# Patient Record
Sex: Male | Born: 1955 | Race: White | Hispanic: No | Marital: Single | Smoking: Former smoker
Health system: Southern US, Community
[De-identification: ages and names within clinical notes are randomized; demographics above are authoritative.]

## PROBLEM LIST (undated history)

## (undated) DIAGNOSIS — Z95828 Presence of other vascular implants and grafts: Secondary | ICD-10-CM

## (undated) DIAGNOSIS — I2510 Atherosclerotic heart disease of native coronary artery without angina pectoris: Secondary | ICD-10-CM

## (undated) DIAGNOSIS — C801 Malignant (primary) neoplasm, unspecified: Secondary | ICD-10-CM

## (undated) DIAGNOSIS — D649 Anemia, unspecified: Secondary | ICD-10-CM

## (undated) DIAGNOSIS — K219 Gastro-esophageal reflux disease without esophagitis: Secondary | ICD-10-CM

## (undated) DIAGNOSIS — N3281 Overactive bladder: Secondary | ICD-10-CM

## (undated) DIAGNOSIS — Z87442 Personal history of urinary calculi: Secondary | ICD-10-CM

## (undated) HISTORY — PX: COLON SURGERY: SHX602

---

## 2020-02-23 ENCOUNTER — Telehealth: Payer: Self-pay | Admitting: Oncology

## 2020-02-23 NOTE — Telephone Encounter (Signed)
A new pt appt has been schedule for Clinton Cordova to see Dr. Benay Spice on 4/5 at 2pm for metastatic rectal cancer. Appt date and time has been given to the pt's sgo. Aware to arrive 15 minutes early.

## 2020-02-26 NOTE — Progress Notes (Signed)
Spoke to patient regarding upcoming appointment he preferred I call and speak to his girlfriend [place].  I called Debbie introduced myself as GI nurse navigator and explained my role.  Informed her his appointment is 4/8 at 2 pm with Dr. Benay Spice to arrive at least 15 minutes early for registration; [place] parking is available.  She is knowledgeable about where we are located and she will be coming with him for his appointment.  She was given my direct phone number for any questions or concerns and verbalized an understanding of all the information given.

## 2020-03-04 ENCOUNTER — Ambulatory Visit: Payer: Self-pay | Admitting: Oncology

## 2020-03-07 ENCOUNTER — Other Ambulatory Visit: Payer: Self-pay

## 2020-03-07 ENCOUNTER — Inpatient Hospital Stay: Payer: Medicare Other | Attending: Oncology | Admitting: Oncology

## 2020-03-07 ENCOUNTER — Encounter (INDEPENDENT_AMBULATORY_CARE_PROVIDER_SITE_OTHER): Payer: Self-pay

## 2020-03-07 VITALS — BP 141/75 | HR 95 | Temp 98.9°F | Resp 20 | Ht 72.0 in | Wt 164.1 lb

## 2020-03-07 DIAGNOSIS — C661 Malignant neoplasm of right ureter: Secondary | ICD-10-CM | POA: Insufficient documentation

## 2020-03-07 DIAGNOSIS — Z7189 Other specified counseling: Secondary | ICD-10-CM | POA: Diagnosis not present

## 2020-03-07 DIAGNOSIS — C787 Secondary malignant neoplasm of liver and intrahepatic bile duct: Secondary | ICD-10-CM | POA: Insufficient documentation

## 2020-03-07 DIAGNOSIS — C786 Secondary malignant neoplasm of retroperitoneum and peritoneum: Secondary | ICD-10-CM | POA: Insufficient documentation

## 2020-03-07 DIAGNOSIS — I110 Hypertensive heart disease with heart failure: Secondary | ICD-10-CM | POA: Insufficient documentation

## 2020-03-07 DIAGNOSIS — C20 Malignant neoplasm of rectum: Secondary | ICD-10-CM | POA: Diagnosis not present

## 2020-03-07 DIAGNOSIS — Z79899 Other long term (current) drug therapy: Secondary | ICD-10-CM | POA: Insufficient documentation

## 2020-03-07 DIAGNOSIS — Z933 Colostomy status: Secondary | ICD-10-CM | POA: Insufficient documentation

## 2020-03-07 DIAGNOSIS — Z5111 Encounter for antineoplastic chemotherapy: Secondary | ICD-10-CM | POA: Insufficient documentation

## 2020-03-07 DIAGNOSIS — I2510 Atherosclerotic heart disease of native coronary artery without angina pectoris: Secondary | ICD-10-CM | POA: Insufficient documentation

## 2020-03-07 DIAGNOSIS — I255 Ischemic cardiomyopathy: Secondary | ICD-10-CM | POA: Insufficient documentation

## 2020-03-07 DIAGNOSIS — N1330 Unspecified hydronephrosis: Secondary | ICD-10-CM | POA: Insufficient documentation

## 2020-03-07 NOTE — Progress Notes (Signed)
[facility] New Patient Consult   Requesting ZO:XWRUEA Liliana Brentlinger 64 y.o.  06-28-1956    Reason for Consult: Rectal cancer   HPI: Mr. Danford presented in March 2020 with abdominal pain.  He was diagnosed with diverticulitis with a rectosigmoid fistula.  He had a prolonged treatment course requiring placement of a drain and underwent a diverting ileostomy in June 2020.  He underwent placement of a drain for a presacral abscess in October 2020.  A colonoscopy on 10/17/2019 revealed an obstructing mass at 10 cm from the anal verge.  A biopsy confirmed adenocarcinoma.  A pelvic MRI was consistent with a T3/4 N1 lesion.  There was no evidence of metastatic disease. He was treated with neoadjuvant radiation He was taken to the operating room by Dr. Cecil Cobbs on 01/17/2020 for a low anterior resection with end colostomy, drainage of pelvic abscess, omental flap, liver biopsy, peritoneal biopsy, and ileostomy reversal.  Operative findings included 2 white deposits at the sacral promontory peritoneum.  A 1 cm liver mass in segment 4 was biopsied.  No other evidence of carcinomatosis or liver metastases.  Chronic posterior perforation of the rectum with a fibrotic mass in the pelvis involving the sidewalls and bladder.  There was right hydroureter with near complete obstruction at the bladder.  Impending obstruction of the left ureter near the bladder. A palliative resection was performed.  A left ureter stent was placed in the right ureter was reimplanted into the dome of the bladder.  The pathology revealed adenocarcinoma involving peritoneal deposits.  The rectal tumor was moderately differentiated and invaded through the peritoneal surface with perforation.  Lymphovascular and perineural invasion were identified.  The distal margin was involved by tumor.  A tumor deposit was present at the vascular pedicle margin.  15 lymph nodes were negative.  The liver biopsy returned  positive for metastatic adenocarcinoma, the distal right ureter biopsy was positive for adenocarcinoma.  Treatment response score 2.  The tumor was staged as a ypT4b,p n1c,p m1c.  Intact mismatch repair protein staining and MSS.  He has recovered from surgery.  Pelvic drains remain in place.  A left ureter stent remains in place.  He saw Dr. Aleatha Borer on 02/23/2020.  Restaging CT reveal a liver metastasis at the hepatic dome and a subcapsular implant at the right medial liver.  Nodularity at the left pelvic sidewall.  Dr. Sigurd Sos recommends treatment with systemic therapy.  He is referred for care closer to home.   Past medical history: 1.  Ischemic cardiomyopathy 2.  Rectal cancer, presenting with a perforated rectal tumor,ypT4b,ypN1c, M1c 3.  Pulmonary hypertension 4.  Hypertension   Past surgical history: 1.  Pilonidal cyst surgery at age 67   Medications: Reviewed  Allergies: Not on File  Family history: No family history of cancer  Social History:   He lives in [place].  He previously worked as a Administrator and on a farm.  He does not use cigarettes.  He has a history of heavy alcohol use, none at present.  He has been transfused with packed red blood cells during the recent rectal cancer illness.  No risk factor for HIV or hepatitis.  ROS:   Positives include: Night sweats on 2 occasions over the past 3 months, nausea following surgery-resolved, rectal bleeding for 6 months prior to diagnosis  A complete ROS was otherwise negative.  Physical Exam:  Blood pressure (!) 141/75, pulse 95, temperature 98.9 F (37.2 C), temperature source  Temporal, resp. rate 20, height 6' (1.829 m), weight 164 lb 1.6 oz (74.4 kg), SpO2 100 %.  HEENT: Neck without mass Lungs: Clear bilaterally Cardiac: Regular rate and rhythm Abdomen: No hepatosplenomegaly, lower abdomen drains, left abdomen colostomy, healed surgical incisions Vascular: No leg edema Lymph nodes: No cervical,  supraclavicular, axillary, or inguinal nodes Neurologic: Alert and oriented, the motor exam appears intact in the upper and lower extremities bilaterally Skin: No rash Musculoskeletal: No spine tenderness    Imaging:  No results found.    Assessment/Plan:   1. Metastatic rectal cancer, perforated rectal tumor with abscess, stage IV  Presented with "diverticulitis "March 2020, status post drainage catheter  Colonoscopy 10/17/2019-obstructing mass at 10 cm, biopsy confirmed adenocarcinoma  MRI pelvis 10/22/2019-T3 versus T4, into, M0 lesion-evaluation limited due to abscess  Neoadjuvant radiation-short course  01/17/2020-palliative low anterior resection, end colostomy, biopsy of peritoneal nodules, liver biopsy, left ureter stent, right ureter reimplantation, placement of pelvic drains  Operative findings included chronic perforation of the rectum with involvement of the pelvic sidewalls and bladder, 2 peritoneal nodules at the sacral promontory, 1 cm mass at segment 4 of the liver  Pathology confirmed a ypT4n, pN1c,M1c, moderately differentiated adenocarcinoma, lymphovascular and perineural invasion present, treatment response score 2, positive distal and radial margins, 0/15 lymph nodes, 1 tumor deposit, no loss of mismatch repair protein expression, MSS, positive for adenocarcinoma involving the right ureter, segment 4 liver, and peritoneal deposits  CTs chest, abdomen, and pelvis 02/23/2020-2 pelvic surgical drains, 2.1 cm hepatic dome hypodensity, 1.8 cm peripheral segment 6/7 lesion, bilateral ureter stents, mild left hydroureteronephrosis, 2 new right lower lobe 0.3 cm nodules-indeterminate  2.   Diverticulitis/perforated rectal tumor-pelvic drains remain in place  3.   Coronary artery disease  4.   Chronic systolic heart failure  5.   History of an LV thrombus, status post course of Eliquis, no thrombus visualized on echocardiogram 09/15/2019, LVEF 45-50  Disposition:    Mr. Gosselin has been diagnosed with metastatic rectal cancer.  He was found to have peritoneal and liver metastases at the time of a palliative low anterior resection procedure.  CTs on 02/23/2020 revealed evidence of liver metastases and new indeterminate lung nodules.  I discussed the prognosis and treatment options with Mr. Alms and his girlfriend.  He understands no therapy will be curative.  The plan is to begin palliative chemotherapy with FOLFOX.  We reviewed potential toxicities of the FOLFOX regimen including the chance for nausea/vomiting, mucositis, diarrhea, alopecia, and hematologic toxicity.  We discussed the sun sensitivity, rash, hyperpigmentation, and hand/foot syndrome seen with 5-fluorouracil.  We discussed the allergic reaction and various types of neuropathy associated with oxaliplatin.  He agrees to proceed.  Mr. Sahni will return for a chemotherapy teaching class and baseline laboratory studies on 03/11/2020.  He will be referred for Port-A-Cath placement.  The plan is to begin FOLFOX on 03/20/2020.  Mr. Opiela will be scheduled for an office visit on 03/20/2020.  Dr. Aleatha Borer has requested Foundation 1 testing and will forward these results to [place].  He will continue follow-up with Dr. Hyman Bower for management of the pelvic drains.  Betsy Coder, MD  03/07/2020, 2:27 PM

## 2020-03-07 NOTE — Progress Notes (Signed)
START ON PATHWAY REGIMEN - Colorectal     A cycle is every 14 days:     Oxaliplatin      Leucovorin      Fluorouracil      Fluorouracil   **Always confirm dose/schedule in your pharmacy ordering system**  Patient Characteristics: Distant Metastases, Nonsurgical Candidate, KRAS/NRAS Mutation Positive/Unknown (BRAF V600 Wild-Type/Unknown), Standard Cytotoxic Therapy, First Line Standard Cytotoxic Therapy, Bevacizumab Ineligible, PS = 0,1 Tumor Location: Colon Therapeutic Status: Distant Metastases Microsatellite/Mismatch Repair Status: MSS/pMMR BRAF Mutation Status: Awaiting Test Results KRAS/NRAS Mutation Status: Awaiting Test Results Standard Cytotoxic Line of Therapy: First Line Standard Cytotoxic Therapy ECOG Performance Status: 1 Bevacizumab Eligibility: Ineligible Intent of Therapy: Non-Curative / Palliative Intent, Discussed with Patient 

## 2020-03-07 NOTE — Progress Notes (Signed)
Met with patient and his girlfriend Debbie at initial medical oncology appointment with Dr. Benay Spice.  I explained my role as GI nurse navigator and they were given my business card with my directly phone number and encouraged to call with any questions or concerns.  I scheduled them for chemo class on 4/15 at 4:00 and lab work at 3:45 same day.  I will call to schedule placing a port a cath and f/u to make sure his first treatment of Folfox is scheduled for Wednesday 4/21 with pump d/c on 4/23.  I answered all their questions and escorted them out.

## 2020-03-08 ENCOUNTER — Telehealth: Payer: Self-pay | Admitting: Oncology

## 2020-03-08 NOTE — Progress Notes (Signed)
Spoke with patient's girlfriend Debbie regarding appointment to have his port placed.  I have it scheduled for Thursday 4/15 to arrive at [facility] at 10:00 am, NPO past Midnight and he must have a driver.  She verbalized an understanding and agrees with the appointment date/time.

## 2020-03-08 NOTE — Telephone Encounter (Signed)
Scheduled per los. Called and left msg. Mailed printout  °

## 2020-03-12 ENCOUNTER — Inpatient Hospital Stay: Payer: Medicare Other

## 2020-03-12 ENCOUNTER — Other Ambulatory Visit: Payer: Self-pay | Admitting: Radiology

## 2020-03-13 ENCOUNTER — Other Ambulatory Visit: Payer: Self-pay | Admitting: Radiology

## 2020-03-13 NOTE — Progress Notes (Signed)
Pharmacist Chemotherapy Monitoring - Initial Assessment    Anticipated start date: 03/19/20  Regimen:  . Are orders appropriate based on the patient's diagnosis, regimen, and cycle? Yes . Does the plan date match the patient's scheduled date? Yes . Is the sequencing of drugs appropriate? Yes . Are the premedications appropriate for the patient's regimen? Yes . Prior Authorization for treatment is: Not Started o If applicable, is the correct biosimilar selected based on the patient's insurance? not applicable  Organ Function and Labs: Marland Kitchen Are dose adjustments needed based on the patient's renal function, hepatic function, or hematologic function? Will need to f/u labs done on 03/19/20 . Are appropriate labs ordered prior to the start of patient's treatment? Yes . Other organ system assessment, if indicated: N/A . The following baseline labs, if indicated, have been ordered: CBC, CMET and CEA ordered for 03/19/20  Dose Assessment: . Are the drug doses appropriate? Yes . Are the following correct: o Drug concentrations Yes o IV fluid compatible with drug Yes o Administration routes Yes o Timing of therapy Yes . If applicable, does the patient have documented access for treatment and/or plans for port-a-cath placement? Yes - 03/14/20 . If applicable, have lifetime cumulative doses been properly documented and assessed? not applicable Lifetime Dose Tracking  No doses have been documented on this patient for the following tracked chemicals: Doxorubicin, Epirubicin, Idarubicin, Daunorubicin, Mitoxantrone, Bleomycin, Oxaliplatin, Carboplatin, Liposomal Doxorubicin  o   Toxicity Monitoring/Prevention: . The patient has the following take home antiemetics prescribed: Ondansetron, put will need Rx for compazine (Aloxi received as premedication) . The patient has the following take home medications prescribed: N/A . Medication allergies and previous infusion related reactions, if applicable, have  been reviewed and addressed. No - allergy status needs to entered/updated . The patient's current medication list has been assessed for drug-drug interactions with their chemotherapy regimen. no significant drug-drug interactions were identified on review.  Order Review: . Are the treatment plan orders signed? No . Is the patient scheduled to see a provider prior to their treatment? Yes  I verify that I have reviewed each item in the above checklist and answered each question accordingly.  Acquanetta Belling 03/13/2020 1:00 PM

## 2020-03-14 ENCOUNTER — Inpatient Hospital Stay (HOSPITAL_BASED_OUTPATIENT_CLINIC_OR_DEPARTMENT_OTHER): Payer: Medicare Other | Admitting: Medical

## 2020-03-14 ENCOUNTER — Ambulatory Visit (HOSPITAL_COMMUNITY)
Admission: RE | Admit: 2020-03-14 | Discharge: 2020-03-14 | Disposition: A | Discharge status: Home / Self Care | Payer: Medicare Other | Source: Ambulatory Visit | Attending: Oncology | Admitting: Oncology

## 2020-03-14 ENCOUNTER — Encounter (HOSPITAL_COMMUNITY): Payer: Self-pay

## 2020-03-14 ENCOUNTER — Inpatient Hospital Stay: Payer: Medicare Other

## 2020-03-14 ENCOUNTER — Other Ambulatory Visit: Payer: Self-pay

## 2020-03-14 ENCOUNTER — Encounter: Payer: Self-pay | Admitting: Oncology

## 2020-03-14 ENCOUNTER — Other Ambulatory Visit: Payer: Self-pay | Admitting: Oncology

## 2020-03-14 ENCOUNTER — Other Ambulatory Visit: Payer: Self-pay | Admitting: Emergency Medicine

## 2020-03-14 VITALS — BP 153/84 | HR 88 | Temp 98.5°F | Resp 20 | Ht 72.0 in | Wt 171.4 lb

## 2020-03-14 DIAGNOSIS — N179 Acute kidney failure, unspecified: Secondary | ICD-10-CM | POA: Diagnosis not present

## 2020-03-14 DIAGNOSIS — Z923 Personal history of irradiation: Secondary | ICD-10-CM | POA: Insufficient documentation

## 2020-03-14 DIAGNOSIS — Z933 Colostomy status: Secondary | ICD-10-CM | POA: Insufficient documentation

## 2020-03-14 DIAGNOSIS — Z87891 Personal history of nicotine dependence: Secondary | ICD-10-CM | POA: Insufficient documentation

## 2020-03-14 DIAGNOSIS — C20 Malignant neoplasm of rectum: Secondary | ICD-10-CM

## 2020-03-14 DIAGNOSIS — D649 Anemia, unspecified: Secondary | ICD-10-CM

## 2020-03-14 DIAGNOSIS — I255 Ischemic cardiomyopathy: Secondary | ICD-10-CM | POA: Diagnosis not present

## 2020-03-14 DIAGNOSIS — Z5111 Encounter for antineoplastic chemotherapy: Secondary | ICD-10-CM | POA: Diagnosis present

## 2020-03-14 DIAGNOSIS — C786 Secondary malignant neoplasm of retroperitoneum and peritoneum: Secondary | ICD-10-CM | POA: Diagnosis not present

## 2020-03-14 DIAGNOSIS — N1330 Unspecified hydronephrosis: Secondary | ICD-10-CM | POA: Diagnosis not present

## 2020-03-14 DIAGNOSIS — C661 Malignant neoplasm of right ureter: Secondary | ICD-10-CM | POA: Diagnosis not present

## 2020-03-14 DIAGNOSIS — I2510 Atherosclerotic heart disease of native coronary artery without angina pectoris: Secondary | ICD-10-CM | POA: Diagnosis not present

## 2020-03-14 DIAGNOSIS — I110 Hypertensive heart disease with heart failure: Secondary | ICD-10-CM | POA: Diagnosis not present

## 2020-03-14 DIAGNOSIS — Z79899 Other long term (current) drug therapy: Secondary | ICD-10-CM | POA: Insufficient documentation

## 2020-03-14 DIAGNOSIS — C787 Secondary malignant neoplasm of liver and intrahepatic bile duct: Secondary | ICD-10-CM | POA: Diagnosis not present

## 2020-03-14 HISTORY — PX: IR IMAGING GUIDED PORT INSERTION: IMG5740

## 2020-03-14 LAB — BASIC METABOLIC PANEL
Anion gap: 10 (ref 5–15)
BUN: 31 mg/dL — ABNORMAL HIGH (ref 8–23)
CO2: 18 mmol/L — ABNORMAL LOW (ref 22–32)
Calcium: 8.9 mg/dL (ref 8.9–10.3)
Chloride: 112 mmol/L — ABNORMAL HIGH (ref 98–111)
Creatinine, Ser: 2.87 mg/dL — ABNORMAL HIGH (ref 0.61–1.24)
GFR calc Af Amer: 25 mL/min — ABNORMAL LOW (ref >60)
GFR calc non Af Amer: 22 mL/min — ABNORMAL LOW (ref >60)
Glucose, Bld: 90 mg/dL (ref 70–99)
Potassium: 4.4 mmol/L (ref 3.5–5.1)
Sodium: 140 mmol/L (ref 135–145)

## 2020-03-14 LAB — CBC WITH DIFFERENTIAL (CANCER CENTER ONLY)
Abs Immature Granulocytes: 0.03 10*3/uL (ref 0.00–0.07)
Basophils Absolute: 0 10*3/uL (ref 0.0–0.1)
Basophils Relative: 0 %
Eosinophils Absolute: 0.4 10*3/uL (ref 0.0–0.5)
Eosinophils Relative: 5 %
HCT: 26 % — ABNORMAL LOW (ref 39.0–52.0)
Hemoglobin: 7.5 g/dL — ABNORMAL LOW (ref 13.0–17.0)
Immature Granulocytes: 0 %
Lymphocytes Relative: 7 %
Lymphs Abs: 0.5 10*3/uL — ABNORMAL LOW (ref 0.7–4.0)
MCH: 24.5 pg — ABNORMAL LOW (ref 26.0–34.0)
MCHC: 28.8 g/dL — ABNORMAL LOW (ref 30.0–36.0)
MCV: 85 fL (ref 80.0–100.0)
Monocytes Absolute: 0.4 10*3/uL (ref 0.1–1.0)
Monocytes Relative: 5 %
Neutro Abs: 6.4 10*3/uL (ref 1.7–7.7)
Neutrophils Relative %: 83 %
Platelet Count: 405 10*3/uL — ABNORMAL HIGH (ref 150–400)
RBC: 3.06 MIL/uL — ABNORMAL LOW (ref 4.22–5.81)
RDW: 16.4 % — ABNORMAL HIGH (ref 11.5–15.5)
WBC Count: 7.7 10*3/uL (ref 4.0–10.5)
nRBC: 0 % (ref 0.0–0.2)

## 2020-03-14 LAB — CBC WITH DIFFERENTIAL/PLATELET
Abs Immature Granulocytes: 0.03 10*3/uL (ref 0.00–0.07)
Basophils Absolute: 0 10*3/uL (ref 0.0–0.1)
Basophils Relative: 0 %
Eosinophils Absolute: 0.5 10*3/uL (ref 0.0–0.5)
Eosinophils Relative: 7 %
HCT: 24.3 % — ABNORMAL LOW (ref 39.0–52.0)
Hemoglobin: 7.1 g/dL — ABNORMAL LOW (ref 13.0–17.0)
Immature Granulocytes: 0 %
Lymphocytes Relative: 14 %
Lymphs Abs: 1 10*3/uL (ref 0.7–4.0)
MCH: 25.4 pg — ABNORMAL LOW (ref 26.0–34.0)
MCHC: 29.2 g/dL — ABNORMAL LOW (ref 30.0–36.0)
MCV: 86.8 fL (ref 80.0–100.0)
Monocytes Absolute: 0.5 10*3/uL (ref 0.1–1.0)
Monocytes Relative: 8 %
Neutro Abs: 4.8 10*3/uL (ref 1.7–7.7)
Neutrophils Relative %: 71 %
Platelets: 364 10*3/uL (ref 150–400)
RBC: 2.8 MIL/uL — ABNORMAL LOW (ref 4.22–5.81)
RDW: 16.3 % — ABNORMAL HIGH (ref 11.5–15.5)
WBC: 6.8 10*3/uL (ref 4.0–10.5)
nRBC: 0 % (ref 0.0–0.2)

## 2020-03-14 LAB — CMP (CANCER CENTER ONLY)
ALT: 8 U/L (ref 0–44)
AST: 7 U/L — ABNORMAL LOW (ref 15–41)
Albumin: 2.7 g/dL — ABNORMAL LOW (ref 3.5–5.0)
Alkaline Phosphatase: 79 U/L (ref 38–126)
Anion gap: 10 (ref 5–15)
BUN: 28 mg/dL — ABNORMAL HIGH (ref 8–23)
CO2: 19 mmol/L — ABNORMAL LOW (ref 22–32)
Calcium: 9.2 mg/dL (ref 8.9–10.3)
Chloride: 112 mmol/L — ABNORMAL HIGH (ref 98–111)
Creatinine: 3.03 mg/dL (ref 0.61–1.24)
GFR, Est AFR Am: 24 mL/min — ABNORMAL LOW (ref >60)
GFR, Estimated: 21 mL/min — ABNORMAL LOW (ref >60)
Glucose, Bld: 171 mg/dL — ABNORMAL HIGH (ref 70–99)
Potassium: 4.4 mmol/L (ref 3.5–5.1)
Sodium: 141 mmol/L (ref 135–145)
Total Bilirubin: <0.2 mg/dL — ABNORMAL LOW (ref 0.3–1.2)
Total Protein: 7.9 g/dL (ref 6.5–8.1)

## 2020-03-14 LAB — PREPARE RBC (CROSSMATCH)

## 2020-03-14 LAB — PROTIME-INR
INR: 1.2 (ref 0.8–1.2)
Prothrombin Time: 14.9 seconds (ref 11.4–15.2)

## 2020-03-14 MED ORDER — LIDOCAINE-EPINEPHRINE 1 %-1:100000 IJ SOLN
INTRAMUSCULAR | Status: AC | PRN
  Administered 2020-03-14 (×2): 10 mL via INTRADERMAL

## 2020-03-14 MED ORDER — MIDAZOLAM HCL 2 MG/2ML IJ SOLN
INTRAMUSCULAR | Status: AC | PRN
  Administered 2020-03-14 (×2): 1 mg via INTRAVENOUS

## 2020-03-14 MED ORDER — SODIUM CHLORIDE 0.9 % IV SOLN: INTRAVENOUS | Status: DC

## 2020-03-14 MED ORDER — CEFAZOLIN SODIUM-DEXTROSE 2-4 GM/100ML-% IV SOLN
INTRAVENOUS | Status: AC
  Administered 2020-03-14: 2 g via INTRAVENOUS
  Filled 2020-03-14: qty 100

## 2020-03-14 MED ORDER — HEPARIN SOD (PORK) LOCK FLUSH 100 UNIT/ML IV SOLN
INTRAVENOUS | Status: AC
  Filled 2020-03-14: qty 5

## 2020-03-14 MED ORDER — LIDOCAINE-EPINEPHRINE 1 %-1:100000 IJ SOLN
INTRAMUSCULAR | Status: AC
  Filled 2020-03-14: qty 1

## 2020-03-14 MED ORDER — MIDAZOLAM HCL 2 MG/2ML IJ SOLN
INTRAMUSCULAR | Status: AC
  Filled 2020-03-14: qty 2

## 2020-03-14 MED ORDER — FENTANYL CITRATE (PF) 100 MCG/2ML IJ SOLN
INTRAMUSCULAR | Status: AC
  Filled 2020-03-14: qty 2

## 2020-03-14 MED ORDER — CEFAZOLIN SODIUM-DEXTROSE 2-4 GM/100ML-% IV SOLN: 2.0000 g | INTRAVENOUS | Status: AC

## 2020-03-14 MED ORDER — HEPARIN SOD (PORK) LOCK FLUSH 100 UNIT/ML IV SOLN
INTRAVENOUS | Status: AC | PRN
  Administered 2020-03-14: 500 [IU] via INTRAVENOUS

## 2020-03-14 MED ORDER — FENTANYL CITRATE (PF) 100 MCG/2ML IJ SOLN
INTRAMUSCULAR | Status: AC | PRN
  Administered 2020-03-14 (×2): 50 ug via INTRAVENOUS

## 2020-03-14 NOTE — Patient Instructions (Signed)
Anemia  Anemia is a condition in which you do not have enough red blood cells or hemoglobin. Hemoglobin is a substance in red blood cells that carries oxygen. When you do not have enough red blood cells or hemoglobin (are anemic), your body cannot get enough oxygen and your organs may not work properly. As a result, you may feel very tired or have other problems. What are the causes? Common causes of anemia include:  Excessive bleeding. Anemia can be caused by excessive bleeding inside or outside the body, including bleeding from the intestine or from periods in women.  Poor nutrition.  Long-lasting (chronic) kidney, thyroid, and liver disease.  Bone marrow disorders.  Cancer and treatments for cancer.  HIV (human immunodeficiency virus) and AIDS (acquired immunodeficiency syndrome).  Treatments for HIV and AIDS.  Spleen problems.  Blood disorders.  Infections, medicines, and autoimmune disorders that destroy red blood cells. What are the signs or symptoms? Symptoms of this condition include:  Minor weakness.  Dizziness.  Headache.  Feeling heartbeats that are irregular or faster than normal (palpitations).  Shortness of breath, especially with exercise.  Paleness.  Cold sensitivity.  Indigestion.  Nausea.  Difficulty sleeping.  Difficulty concentrating. Symptoms may occur suddenly or develop slowly. If your anemia is mild, you may not have symptoms. How is this diagnosed? This condition is diagnosed based on:  Blood tests.  Your medical history.  A physical exam.  Bone marrow biopsy. Your health care provider may also check your stool (feces) for blood and may do additional testing to look for the cause of your bleeding. You may also have other tests, including:  Imaging tests, such as a CT scan or MRI.  Endoscopy.  Colonoscopy. How is this treated? Treatment for this condition depends on the cause. If you continue to lose a lot of blood, you may  need to be treated at a hospital. Treatment may include:  Taking supplements of iron, vitamin S31, or folic acid.  Taking a hormone medicine (erythropoietin) that can help to stimulate red blood cell growth.  Having a blood transfusion. This may be needed if you lose a lot of blood.  Making changes to your diet.  Having surgery to remove your spleen. Follow these instructions at home:  Take over-the-counter and prescription medicines only as told by your health care provider.  Take supplements only as told by your health care provider.  Follow any diet instructions that you were given.  Keep all follow-up visits as told by your health care provider. This is important. Contact a health care provider if:  You develop new bleeding anywhere in the body. Get help right away if:  You are very weak.  You are short of breath.  You have pain in your abdomen or chest.  You are dizzy or feel faint.  You have trouble concentrating.  You have bloody or black, tarry stools.  You vomit repeatedly or you vomit up blood. Summary  Anemia is a condition in which you do not have enough red blood cells or enough of a substance in your red blood cells that carries oxygen (hemoglobin).  Symptoms may occur suddenly or develop slowly.  If your anemia is mild, you may not have symptoms.  This condition is diagnosed with blood tests as well as a medical history and physical exam. Other tests may be needed.  Treatment for this condition depends on the cause of the anemia. This information is not intended to replace advice given to you by  your health care provider. Make sure you discuss any questions you have with your health care provider. Document Revised: 10/29/2017 Document Reviewed: 12/18/2016 Elsevier Patient Education  [employer].

## 2020-03-14 NOTE — Progress Notes (Signed)
Received critical lab value: Creatinine 3.03.  PA Lucianne Lei made aware.  Will recheck creatinine tomorrow when accessing port before pt's blood transfusion.  Pt aware to push oral fluids at home, and to bring blue blood bracelet to appt tomorrow at 12. Blood transfusion orders placed, VO to cancel benadryl pre-med as pt is driving himself tomorrow.

## 2020-03-14 NOTE — Procedures (Signed)
Interventional Radiology Procedure Note  Procedure: RT IJ POWER PORT  Complications: None  Estimated Blood Loss: MIN  Findings: TIP SVCRA       

## 2020-03-14 NOTE — Discharge Instructions (Addendum)
Please call Interventional Radiology clinic [phone] with any questions or concerns about your port.  You may remove your dressing and shower tomorrow.  DO NOT use EMLA cream for 2 weeks after port placement as this cream will remove surgical glue on your incision.  Implanted Port Insertion, Care After This sheet gives you information about how to care for yourself after your procedure. Your health care provider may also give you more specific instructions. If you have problems or questions, contact your health care provider. What can I expect after the procedure? After the procedure, it is common to have:  Discomfort at the port insertion site.  Bruising on the skin over the port. This should improve over 3-4 days. Follow these instructions at home: Port care  After your port is placed, you will get a manufacturer's information card. The card has information about your port. Keep this card with you at all times.  Take care of the port as told by your health care provider. Ask your health care provider if you or a family member can get training for taking care of the port at home. A home health care nurse may also take care of the port.  Make sure to remember what type of port you have. Incision care      Follow instructions from your health care provider about how to take care of your port insertion site. Make sure you: ? Wash your hands with soap and water before and after you change your bandage (dressing). If soap and water are not available, use hand sanitizer. ? Change your dressing as told by your health care provider. ? Leave stitches (sutures), skin glue, or adhesive strips in place. These skin closures may need to stay in place for 2 weeks or longer. If adhesive strip edges start to loosen and curl up, you may trim the loose edges. Do not remove adhesive strips completely unless your health care provider tells you to do that.  Check your port insertion site every day for  signs of infection. Check for: ? Redness, swelling, or pain. ? Fluid or blood. ? Warmth. ? Pus or a bad smell. Activity  Return to your normal activities as told by your health care provider. Ask your health care provider what activities are safe for you.  Do not lift anything that is heavier than 10 lb (4.5 kg), or the limit that you are told, until your health care provider says that it is safe. General instructions  Take over-the-counter and prescription medicines only as told by your health care provider.  Do not take baths, swim, or use a hot tub until your health care provider approves. Ask your health care provider if you may take showers. You may only be allowed to take sponge baths.  Do not drive for 24 hours if you were given a sedative during your procedure.  Wear a medical alert bracelet in case of an emergency. This will tell any health care providers that you have a port.  Keep all follow-up visits as told by your health care provider. This is important. Contact a health care provider if:  You cannot flush your port with saline as directed, or you cannot draw blood from the port.  You have a fever or chills.  You have redness, swelling, or pain around your port insertion site.  You have fluid or blood coming from your port insertion site.  Your port insertion site feels warm to the touch.  You have pus or   a bad smell coming from the port insertion site. Get help right away if:  You have chest pain or shortness of breath.  You have bleeding from your port that you cannot control. Summary  Take care of the port as told by your health care provider. Keep the manufacturer's information card with you at all times.  Change your dressing as told by your health care provider.  Contact a health care provider if you have a fever or chills or if you have redness, swelling, or pain around your port insertion site.  Keep all follow-up visits as told by your health care  provider. This information is not intended to replace advice given to you by your health care provider. Make sure you discuss any questions you have with your health care provider. Document Revised: 06/14/2018 Document Reviewed: 06/14/2018 Elsevier Patient Education  2020 Elsevier Inc.    Moderate Conscious Sedation, Adult, Care After These instructions provide you with information about caring for yourself after your procedure. Your health care provider may also give you more specific instructions. Your treatment has been planned according to current medical practices, but problems sometimes occur. Call your health care provider if you have any problems or questions after your procedure. What can I expect after the procedure? After your procedure, it is common:  To feel sleepy for several hours.  To feel clumsy and have poor balance for several hours.  To have poor judgment for several hours.  To vomit if you eat too soon. Follow these instructions at home: For at least 24 hours after the procedure:   Do not: ? Participate in activities where you could fall or become injured. ? Drive. ? Use heavy machinery. ? Drink alcohol. ? Take sleeping pills or medicines that cause drowsiness. ? Make important decisions or sign legal documents. ? Take care of children on your own.  Rest. Eating and drinking  Follow the diet recommended by your health care provider.  If you vomit: ? Drink water, juice, or soup when you can drink without vomiting. ? Make sure you have little or no nausea before eating solid foods. General instructions  Have a responsible adult stay with you until you are awake and alert.  Take over-the-counter and prescription medicines only as told by your health care provider.  If you smoke, do not smoke without supervision.  Keep all follow-up visits as told by your health care provider. This is important. Contact a health care provider if:  You keep feeling  nauseous or you keep vomiting.  You feel light-headed.  You develop a rash.  You have a fever. Get help right away if:  You have trouble breathing. This information is not intended to replace advice given to you by your health care provider. Make sure you discuss any questions you have with your health care provider. Document Revised: 10/29/2017 Document Reviewed: 03/07/2016 Elsevier Patient Education  2020 Elsevier Inc.  

## 2020-03-14 NOTE — Progress Notes (Signed)
cbc

## 2020-03-14 NOTE — Progress Notes (Signed)
Met with patient at registration to introduce myself as Financial Resource Specialist and to offer available resources.  Discussed one-time $1000 [employer] grant and qualifications to assist with personal expenses while going through treatment.  Gave him my card if interested in applying and for any additional financial questions or concerns.  

## 2020-03-14 NOTE — H&P (Signed)
Chief Complaint: Patient was seen in consultation today for rectal cancer/Port-a-cath placement.  Referring Physician(s): Ladell Pier  Supervising Physician: Daryll Brod  Patient Status: [facility] - Out-pt  History of Present Illness: Clinton Cordova is a 64 y.o. male with a past medical history of rectal cancer. He was unfortunately diagnosed with metastatic rectal cancer in 01/2019. His cancer is managed by Dr. Benay Spice. He has undergone a short course of neoadjuvant radiation, followed by palliative low anterior resection and end colostomy in OR 01/17/2020 by Dr. Cecil Cobbs. He has tentative plans to begin systemic chemotherapy as management.  IR requested by Dr. Benay Spice for possible image-guided Port-a- cath placement. Patient awake and alert laying in bed with no complaints at this time. Denies fever, chills, chest pain, dyspnea, abdominal pain, or headache.   History reviewed. No pertinent past medical history.  History reviewed. No pertinent surgical history.  Allergies: Patient has no allergy information on record.  Medications: Prior to Admission medications   Medication Sig Start Date End Date Taking? Authorizing Provider  metoprolol succinate (TOPROL-XL) 100 MG 24 hr tablet Take 100 mg by mouth daily. 10/02/19   [provider]  Multiple Vitamins-Minerals (ONE-A-DAY 50 PLUS PO) Take 1 tablet by mouth daily.    [provider]  naproxen sodium (ALEVE) 220 MG tablet Take 220 mg by mouth in the morning, at noon, in the evening, and at bedtime.    [provider]  ondansetron (ZOFRAN-ODT) 4 MG disintegrating tablet Take 4 mg by mouth every 8 (eight) hours as needed. 02/16/20   [provider]     History reviewed. No pertinent family history.  Social History   Socioeconomic History  . Marital status: Single    Spouse name: Not on file  . Number of children: Not on file  . Years of education: Not on file  . Highest education  level: Not on file  Occupational History  . Not on file  Tobacco Use  . Smoking status: Former Research scientist (life sciences)  . Smokeless tobacco: Never Used  Substance and Sexual Activity  . Alcohol use: Not on file  . Drug use: Not on file  . Sexual activity: Not on file  Other Topics Concern  . Not on file  Social History Narrative  . Not on file   Social Determinants of Health   Financial Resource Strain:   . Difficulty of Paying Living Expenses:   Food Insecurity:   . Worried About Charity fundraiser in the Last Year:   . Arboriculturist in the Last Year:   Transportation Needs:   . Film/video editor (Medical):   Marland Kitchen Lack of Transportation (Non-Medical):   Physical Activity:   . Days of Exercise per Week:   . Minutes of Exercise per Session:   Stress:   . Feeling of Stress :   Social Connections:   . Frequency of Communication with Friends and Family:   . Frequency of Social Gatherings with Friends and Family:   . Attends Religious Services:   . Active Member of Clubs or Organizations:   . Attends Archivist Meetings:   Marland Kitchen Marital Status:      Review of Systems: A 12 point ROS discussed and pertinent positives are indicated in the HPI above.  All other systems are negative.  Review of Systems  Vital Signs: There were no vitals taken for this visit.  Physical Exam   MD Evaluation Airway: WNL Heart: WNL Abdomen: WNL Chest/ Lungs: WNL  ASA  Classification: 3 Mallampati/Airway Score: Two   Imaging: No results found.  Labs:  CBC: Recent Labs    03/14/20 1033  WBC 6.8  HGB 7.1*  HCT 24.3*  PLT 364    COAGS: No results for input(s): INR, APTT in the last 8760 hours.   Assessment and Plan:  Stage IV rectal cancer s/p short course of neoadjuvant radiation along with palliative low anterior resection and end colostomy in OR 01/17/2020 by Dr. Cecil Cobbs; with tentative plans to begin systemic chemotherapy as management. Plan for image-guided Port-a-cath  placement today in IR. Patient is NPO. Afebrile and WBCs WNL.  Risks and benefits of image-guided Port-a-catheter placement were discussed with the patient including, but not limited to bleeding, infection, pneumothorax, or fibrin sheath development and need for additional procedures. All of the patient's questions were answered, patient is agreeable to proceed. Consent signed and in chart.   Thank you for this interesting consult.  I greatly enjoyed meeting Clinton Cordova and look forward to participating in their care.  A copy of this report was sent to the requesting provider on this date.  Electronically Signed: Earley Abide, PA-C 03/14/2020, 11:08 AM   I spent a total of 30 Minutes in face to face in clinical consultation, greater than 50% of which was counseling/coordinating care for rectal cancer/Port-a-cath placement.

## 2020-03-15 ENCOUNTER — Other Ambulatory Visit: Payer: Self-pay

## 2020-03-15 ENCOUNTER — Other Ambulatory Visit: Payer: Self-pay | Admitting: *Deleted

## 2020-03-15 ENCOUNTER — Ambulatory Visit (HOSPITAL_BASED_OUTPATIENT_CLINIC_OR_DEPARTMENT_OTHER): Payer: Medicare Other | Admitting: Medical

## 2020-03-15 ENCOUNTER — Inpatient Hospital Stay: Payer: Medicare Other

## 2020-03-15 ENCOUNTER — Ambulatory Visit (HOSPITAL_COMMUNITY)
Admission: RE | Admit: 2020-03-15 | Discharge: 2020-03-15 | Disposition: A | Discharge status: Home / Self Care | Payer: Medicare Other | Source: Ambulatory Visit | Attending: Medical | Admitting: Medical

## 2020-03-15 ENCOUNTER — Other Ambulatory Visit: Payer: Medicare Other

## 2020-03-15 DIAGNOSIS — C20 Malignant neoplasm of rectum: Secondary | ICD-10-CM | POA: Diagnosis not present

## 2020-03-15 DIAGNOSIS — N179 Acute kidney failure, unspecified: Secondary | ICD-10-CM

## 2020-03-15 DIAGNOSIS — Z5111 Encounter for antineoplastic chemotherapy: Secondary | ICD-10-CM | POA: Diagnosis not present

## 2020-03-15 DIAGNOSIS — D649 Anemia, unspecified: Secondary | ICD-10-CM

## 2020-03-15 LAB — BASIC METABOLIC PANEL - CANCER CENTER ONLY
Anion gap: 11 (ref 5–15)
BUN: 28 mg/dL — ABNORMAL HIGH (ref 8–23)
CO2: 17 mmol/L — ABNORMAL LOW (ref 22–32)
Calcium: 8.9 mg/dL (ref 8.9–10.3)
Chloride: 113 mmol/L — ABNORMAL HIGH (ref 98–111)
Creatinine: 2.92 mg/dL — ABNORMAL HIGH (ref 0.61–1.24)
GFR, Est AFR Am: 25 mL/min — ABNORMAL LOW (ref >60)
GFR, Estimated: 22 mL/min — ABNORMAL LOW (ref >60)
Glucose, Bld: 104 mg/dL — ABNORMAL HIGH (ref 70–99)
Potassium: 4.6 mmol/L (ref 3.5–5.1)
Sodium: 141 mmol/L (ref 135–145)

## 2020-03-15 LAB — CEA (IN HOUSE-CHCC): CEA (CHCC-In House): 3.28 ng/mL (ref 0.00–5.00)

## 2020-03-15 LAB — ABO/RH: ABO/RH(D): A NEG

## 2020-03-15 MED ORDER — ACETAMINOPHEN 325 MG PO TABS
650.0000 mg | ORAL_TABLET | Freq: Once | ORAL | Status: AC
  Administered 2020-03-15: 12:00:00 650 mg via ORAL

## 2020-03-15 MED ORDER — LIDOCAINE-PRILOCAINE 2.5-2.5 % EX CREA: 1.0000 "application " | TOPICAL_CREAM | CUTANEOUS | 2 refills | Status: DC

## 2020-03-15 MED ORDER — ACETAMINOPHEN 325 MG PO TABS
ORAL_TABLET | ORAL | Status: AC
  Filled 2020-03-15: qty 2

## 2020-03-15 MED ORDER — HEPARIN SOD (PORK) LOCK FLUSH 100 UNIT/ML IV SOLN
500.0000 [IU] | Freq: Every day | INTRAVENOUS | Status: AC | PRN
  Administered 2020-03-15: 15:00:00 500 [IU]
  Filled 2020-03-15: qty 5

## 2020-03-15 MED ORDER — SODIUM CHLORIDE 0.9% FLUSH
10.0000 mL | INTRAVENOUS | Status: AC | PRN
  Administered 2020-03-15: 15:00:00 10 mL
  Filled 2020-03-15: qty 10

## 2020-03-15 MED ORDER — SODIUM CHLORIDE 0.9 % IV SOLN
INTRAVENOUS | Status: DC
  Filled 2020-03-15 (×2): qty 250

## 2020-03-15 MED ORDER — SODIUM CHLORIDE 0.9% IV SOLUTION
250.0000 mL | Freq: Once | INTRAVENOUS | Status: AC
  Administered 2020-03-15: 13:00:00 250 mL via INTRAVENOUS
  Filled 2020-03-15: qty 250

## 2020-03-15 MED ORDER — PROCHLORPERAZINE MALEATE 10 MG PO TABS: 10.0000 mg | ORAL_TABLET | Freq: Four times a day (QID) | ORAL | 1 refills | Status: DC | PRN

## 2020-03-15 NOTE — Progress Notes (Signed)
VO to cancel CBC, only draw BMP today.  Lab alerted.  VO per MD Sherrill to administer IVF before and after 1 unit blood d/t elevated creatinine.  BMP drawn before starting IVF or blood.  MD Sherrill aware of results.  Pt received ~700 ml IVF today and 1 unit PRBCs, tolerated well.  Declined anything to eat but was able to drink/use restroom during tx w/out any issues.  Pt transported to [place] appt afterwards w/belongings via w/c (able to ambulate as needed w/steady gait).  Seen by MD Benay Spice during transfusion to update pt on plan of care.  Pt to wait in [place] until results are called to MD Benay Spice, after which (if stable) he will be d/c home.  Pt's spouse will be able to drive him home if needed.

## 2020-03-15 NOTE — Progress Notes (Signed)
Sympt[facility] Management Clinic Progress Note   Clinton Cordova:[id number] November 02, 1956 64 y.o.  Clinton Cordova is managed by Dr. Dominica Severin B. Sherrill  Actively treated with chemotherapy/immunotherapy/hormonal therapy: Chemotherapy start pending  Next scheduled appointment with provider: 03/19/2020  Assessment: Plan:    Rectal cancer ([place]) - Plan: CANCELED: CMP ([place] only)  Acute renal failure, unspecified acute renal failure type ([place]) - Plan: [place] Only, US RENAL  Anemia, unspecified type   Metastatic rectal cancer with liver metastasis: The patient presents to the clinic today prior to plans to begin chemotherapy on 03/19/2020.  Acute renal failure: The patient's chemistry panel returned today with a creatinine of 3.03.  He is told to push fluids tonight.  He will return to the clinic tomorrow for a repeat chemistry panel.  If his creatinine continues to remain elevated then he will be referred to have a renal ultrasound completed.  Symptomatic anemia: The patient's hemoglobin returned at 7.5 today.  He will return tomorrow for a transfusion of 1 unit of packed red blood cells.  Please see After Visit Summary for patient specific instructions.  Future Appointments  Date Time Provider [place]  03/19/2020 10:45 AM Owens Shark, NP [facility] None  03/19/2020 11:45 AM [facility] INFUSION [facility] None    Orders Placed This Encounter  Procedures  . US RENAL  . Basic Metabolic Panel - Cancer Center Only       Subjective:   Patient ID:  Clinton Cordova is a 64 y.o. (DOB 1956/02/27) male.  Chief Complaint:  Chief Complaint  Patient presents with  . Fatigue    HPI Clinton Cordova  is a 64 y.o. male with a diagnosis of a recently diagnosed metastatic rectal cancer.  He has recently had surgery completed at [facility] in [place].  He was found to have liver metastasis.  He presents to the clinic today having had a  port placed earlier today.  He had labs completed earlier today that showed an elevated creatinine at 2.87 and anemia with a hemoglobin of 7.1.  On review of the patient's records from [facility] was noted that he had a elevation of his creatinine the evening before he was to be discharged.  He was kept an additional day was given extra IV fluids.  He has bilateral ureteral stents.  The recheck of his creatinine at that time showed improvement.  He reports that he has been eating and drinking well.  His wife does report that he has been outside working in the yard more this week.  He reports having fatigue but denies chest pain, shortness of breath, dyspnea on exertion, fevers, chills, sweats, nausea, or vomiting.  The output to his ostomy bag is solid.  Medications: I have reviewed the patient's current medications.  Allergies: No Known Allergies  No past medical history on file.  Past Surgical History:  Procedure Laterality Date  . IR IMAGING GUIDED PORT INSERTION  03/14/2020    No family history on file.  Social History   Socioeconomic History  . Marital status: Single    Spouse name: Not on file  . Number of children: Not on file  . Years of education: Not on file  . Highest education level: Not on file  Occupational History  . Not on file  Tobacco Use  . Smoking status: Former Research scientist (life sciences)  . Smokeless tobacco: Never Used  Substance and Sexual Activity  . Alcohol use: Not on  file  . Drug use: Not on file  . Sexual activity: Not on file  Other Topics Concern  . Not on file  Social History Narrative  . Not on file   Social Determinants of Health   Financial Resource Strain:   . Difficulty of Paying Living Expenses:   Food Insecurity:   . Worried About Charity fundraiser in the Last Year:   . Arboriculturist in the Last Year:   Transportation Needs:   . Film/video editor (Medical):   Marland Kitchen Lack of Transportation (Non-Medical):   Physical Activity:   . Days of Exercise  per Week:   . Minutes of Exercise per Session:   Stress:   . Feeling of Stress :   Social Connections:   . Frequency of Communication with Friends and Family:   . Frequency of Social Gatherings with Friends and Family:   . Attends Religious Services:   . Active Member of Clubs or Organizations:   . Attends Archivist Meetings:   Marland Kitchen Marital Status:   Intimate Partner Violence:   . Fear of Current or Ex-Partner:   . Emotionally Abused:   Marland Kitchen Physically Abused:   . Sexually Abused:     Past Medical History, Surgical history, Social history, and Family history were reviewed and updated as appropriate.   Please see review of systems for further details on the patient's review from today.   Review of Systems:  Review of Systems  Constitutional: Positive for fatigue. Negative for chills, diaphoresis and fever.  HENT: Negative for trouble swallowing and voice change.   Respiratory: Negative for cough, chest tightness, shortness of breath and wheezing.   Cardiovascular: Negative for chest pain and palpitations.  Gastrointestinal: Negative for abdominal pain, constipation, diarrhea, nausea and vomiting.  Musculoskeletal: Negative for back pain and myalgias.  Neurological: Negative for dizziness, light-headedness and headaches.    Objective:   Physical Exam:  BP (!) 153/84 (BP Location: Left Arm, Patient Position: Sitting)   Pulse 88   Temp 98.5 F (36.9 C) (Temporal)   Resp 20   Ht 6' (1.829 m)   Wt 171 lb 6.4 oz (77.7 kg)   SpO2 100%   BMI 23.25 kg/m  ECOG: 0  Physical Exam Constitutional:      General: He is not in acute distress.    Appearance: He is not diaphoretic.  HENT:     Head: Normocephalic and atraumatic.  Cardiovascular:     Rate and Rhythm: Normal rate and regular rhythm.     Heart sounds: Normal heart sounds. No murmur. No friction rub. No gallop.   Pulmonary:     Effort: Pulmonary effort is normal. No respiratory distress.     Breath sounds:  Normal breath sounds. No wheezing or rales.  Chest:    Abdominal:     General: Bowel sounds are normal. There is no distension.     Tenderness: There is no abdominal tenderness. There is no guarding.    Skin:    General: Skin is warm and dry.     Findings: No erythema or rash.  Neurological:     Mental Status: He is alert.     Lab Review:     Component Value Date/Time   NA 141 03/15/2020 1148   K 4.6 03/15/2020 1148   CL 113 (H) 03/15/2020 1148   CO2 17 (L) 03/15/2020 1148   GLUCOSE 104 (H) 03/15/2020 1148   BUN 28 (H) 03/15/2020 1148  CREATININE 2.92 (H) 03/15/2020 1148   CALCIUM 8.9 03/15/2020 1148   PROT 7.9 03/14/2020 1450   ALBUMIN 2.7 (L) 03/14/2020 1450   AST 7 (L) 03/14/2020 1450   ALT 8 03/14/2020 1450   ALKPHOS 79 03/14/2020 1450   BILITOT <0.2 (L) 03/14/2020 1450   GFRNONAA 22 (L) 03/15/2020 1148   GFRAA 25 (L) 03/15/2020 1148       Component Value Date/Time   WBC 7.7 03/14/2020 1450   WBC 6.8 03/14/2020 1033   RBC 3.06 (L) 03/14/2020 1450   HGB 7.5 (L) 03/14/2020 1450   HCT 26.0 (L) 03/14/2020 1450   PLT 405 (H) 03/14/2020 1450   MCV 85.0 03/14/2020 1450   MCH 24.5 (L) 03/14/2020 1450   MCHC 28.8 (L) 03/14/2020 1450   RDW 16.4 (H) 03/14/2020 1450   LYMPHSABS 0.5 (L) 03/14/2020 1450   MONOABS 0.4 03/14/2020 1450   EOSABS 0.4 03/14/2020 1450   BASOSABS 0.0 03/14/2020 1450   -------------------------------  Imaging from last 24 hours (if applicable):  Radiology interpretation: IR IMAGING GUIDED PORT INSERTION  Result Date: 03/14/2020 CLINICAL DATA:  RECTAL CANCER, ACCESS FOR CHEMOTHERAPY EXAM: RIGHT INTERNAL JUGULAR SINGLE LUMEN POWER PORT CATHETER INSERTION Date:  03/14/2020 03/14/2020 11:59 am Radiologist:  M. Daryll Brod, MD Guidance:  Ultrasound and fluoroscopic MEDICATIONS: Ancef 2 g; The antibiotic was administered within an appropriate time interval prior to skin puncture. ANESTHESIA/SEDATION: Versed 2.0 mg IV; Fentanyl 100 mcg IV;  Moderate Sedation Time:  20 minutes The patient was continuously monitored during the procedure by the interventional radiology nurse under my direct supervision. FLUOROSCOPY TIME:  0 minutes, 24 seconds (4 mGy) COMPLICATIONS: None immediate. CONTRAST:  None PROCEDURE: Informed consent was obtained from the patient following explanation of the procedure, risks, benefits and alternatives. The patient understands, agrees and consents for the procedure. All questions were addressed. A time out was performed. Maximal barrier sterile technique utilized including caps, mask, sterile gowns, sterile gloves, large sterile drape, hand hygiene, and 2% chlorhexidine scrub. Under sterile conditions and local anesthesia, right internal jugular micropuncture venous access was performed. Access was performed with ultrasound. Images were obtained for documentation of the patent right internal jugular vein. A guide wire was inserted followed by a transitional dilator. This allowed insertion of a guide wire and catheter into the IVC. Measurements were obtained from the SVC / RA junction back to the right IJ venotomy site. In the right infraclavicular chest, a subcutaneous pocket was created over the second anterior rib. This was done under sterile conditions and local anesthesia. 1% lidocaine with epinephrine was utilized for this. A 2.5 cm incision was made in the skin. Blunt dissection was performed to create a subcutaneous pocket over the right pectoralis major muscle. The pocket was flushed with saline vigorously. There was adequate hemostasis. The port catheter was assembled and checked for leakage. The port catheter was secured in the pocket with two retention sutures. The tubing was tunneled subcutaneously to the right venotomy site and inserted into the SVC/RA junction through a valved peel-away sheath. Position was confirmed with fluoroscopy. Images were obtained for documentation. The patient tolerated the procedure well. No  immediate complications. Incisions were closed in a two layer fashion with 4 - 0 Vicryl suture. Dermabond was applied to the skin. The port catheter was accessed, blood was aspirated followed by saline and heparin flushes. Needle was removed. A dry sterile dressing was applied. IMPRESSION: Ultrasound and fluoroscopically guided right internal jugular single lumen power port catheter insertion. Tip  in the SVC/RA junction. Catheter ready for use. Electronically Signed   By: Jerilynn Mages.  Shick M.D.   On: 03/14/2020 12:10        This case was discussed with Dr. Benay Spice. He expressed agreement with my management of this patient.

## 2020-03-15 NOTE — Patient Instructions (Signed)
Blood Transfusion, Adult A blood transfusion is a procedure in which you receive blood or a type of blood cell (blood component) through an IV. You may need a blood transfusion when your blood level is low. This may result from a bleeding disorder, illness, injury, or surgery. The blood may come from a donor. You may also be able to donate blood for yourself (autologous blood donation) before a planned surgery. The blood given in a transfusion is made up of different blood components. You may receive:  Red blood cells. These carry oxygen to the cells in the body.  Platelets. These help your blood to clot.  Plasma. This is the liquid part of your blood. It carries proteins and other substances throughout the body.  White blood cells. These help you fight infections. If you have hemophilia or another clotting disorder, you may also receive other types of blood products. Tell a health care provider about:  Any blood disorders you have.  Any previous reactions you have had during a blood transfusion.  Any allergies you have.  All medicines you are taking, including vitamins, herbs, eye drops, creams, and over-the-counter medicines.  Any surgeries you have had.  Any medical conditions you have, including any recent fever or cold symptoms.  Whether you are pregnant or may be pregnant. What are the risks? Generally, this is a safe procedure. However, problems may occur.  The most common problems include: ? A mild allergic reaction, such as red, swollen areas of skin (hives) and itching. ? Fever or chills. This may be the body's response to new blood cells received. This may occur during or up to 4 hours after the transfusion.  More serious problems may include: ? Transfusion-associated circulatory overload (TACO), or too much fluid in the lungs. This may cause breathing problems. ? A serious allergic reaction, such as difficulty breathing or swelling around the face and  lips. ? Transfusion-related acute lung injury (TRALI), which causes breathing difficulty and low oxygen in the blood. This can occur within hours of the transfusion or several days later. ? Iron overload. This can happen after receiving many blood transfusions over a period of time. ? Infection or virus being transmitted. This is rare because donated blood is carefully tested before it is given. ? Hemolytic transfusion reaction. This is rare. It happens when your body's defense system (immune system)tries to attack the new blood cells. Symptoms may include fever, chills, nausea, low blood pressure, and low back or chest pain. ? Transfusion-associated graft-versus-host disease (TAGVHD). This is rare. It happens when donated cells attack your body's healthy tissues. What happens before the procedure? Medicines Ask your health care provider about:  Changing or stopping your regular medicines. This is especially important if you are taking diabetes medicines or blood thinners.  Taking medicines such as aspirin and ibuprofen. These medicines can thin your blood. Do not take these medicines unless your health care provider tells you to take them.  Taking over-the-counter medicines, vitamins, herbs, and supplements. General instructions  Follow instructions from your health care provider about eating and drinking restrictions.  You will have a blood test to determine your blood type. This is necessary to know what kind of blood your body will accept and to match it to the donor blood.  If you are going to have a planned surgery, you may be able to do an autologous blood donation. This may be done in case you need to have a transfusion.  You will have your temperature,   blood pressure, and pulse monitored before the transfusion.  If you have had an allergic reaction to a transfusion in the past, you may be given medicine to help prevent a reaction. This medicine may be given to you by mouth (orally)  or through an IV.  Set aside time for the blood transfusion. This procedure generally takes 1-4 hours to complete. What happens during the procedure?   An IV will be inserted into one of your veins.  The bag of donated blood will be attached to your IV. The blood will then enter through your vein.  Your temperature, blood pressure, and pulse will be monitored regularly during the transfusion. This monitoring is done to detect early signs of a transfusion reaction.  Tell your nurse right away if you have any of these symptoms during the transfusion: ? Shortness of breath or trouble breathing. ? Chest or back pain. ? Fever or chills. ? Hives or itching.  If you have any signs or symptoms of a reaction, your transfusion will be stopped and you may be given medicine.  When the transfusion is complete, your IV will be removed.  Pressure may be applied to the IV site for a few minutes.  A bandage (dressing)will be applied. The procedure may vary among health care providers and hospitals. What happens after the procedure?  Your temperature, blood pressure, pulse, breathing rate, and blood oxygen level will be monitored until you leave the hospital or clinic.  Your blood may be tested to see how you are responding to the transfusion.  You may be warmed with fluids or blankets to maintain a normal body temperature.  If you receive your blood transfusion in an outpatient setting, you will be told whom to contact to report any reactions. Where to find more information For more information on blood transfusions, visit the American Red Cross: redcross.org Summary  A blood transfusion is a procedure in which you receive blood or a type of blood cell (blood component) through an IV.  The blood you receive may come from a donor or be donated by yourself (autologous blood donation) before a planned surgery.  The blood given in a transfusion is made up of different blood components. You may  receive red blood cells, platelets, plasma, or white blood cells depending on the condition treated.  Your temperature, blood pressure, and pulse will be monitored before, during, and after the transfusion.  After the transfusion, your blood may be tested to see how your body has responded. This information is not intended to replace advice given to you by your health care provider. Make sure you discuss any questions you have with your health care provider. Document Revised: 05/11/2019 Document Reviewed: 05/11/2019 Elsevier Patient Education  2020 Elsevier Inc.   Blood Transfusion, Adult, Care After This sheet gives you information about how to care for yourself after your procedure. Your doctor may also give you more specific instructions. If you have problems or questions, contact your doctor. What can I expect after the procedure? After the procedure, it is common to have:  Bruising and soreness at the IV site.  A fever or chills on the day of the procedure. This may be your body's response to the new blood cells received.  A headache. Follow these instructions at home: Insertion site care      Follow instructions from your doctor about how to take care of your insertion site. This is where an IV tube was put into your vein. Make sure   you: ? Wash your hands with soap and water before and after you change your bandage (dressing). If you cannot use soap and water, use hand sanitizer. ? Change your bandage as told by your doctor.  Check your insertion site every day for signs of infection. Check for: ? Redness, swelling, or pain. ? Bleeding from the site. ? Warmth. ? Pus or a bad smell. General instructions  Take over-the-counter and prescription medicines only as told by your doctor.  Rest as told by your doctor.  Go back to your normal activities as told by your doctor.  Keep all follow-up visits as told by your doctor. This is important. Contact a doctor if:  You  have itching or red, swollen areas of skin (hives).  You feel worried or nervous (anxious).  You feel weak after doing your normal activities.  You have redness, swelling, warmth, or pain around the insertion site.  You have blood coming from the insertion site, and the blood does not stop with pressure.  You have pus or a bad smell coming from the insertion site. Get help right away if:  You have signs of a serious reaction. This may be coming from an allergy or the body's defense system (immune system). Signs include: ? Trouble breathing or shortness of breath. ? Swelling of the face or feeling warm (flushed). ? Fever or chills. ? Head, chest, or back pain. ? Dark pee (urine) or blood in the pee. ? Widespread rash. ? Fast heartbeat. ? Feeling dizzy or light-headed. You may receive your blood transfusion in an outpatient setting. If so, you will be told whom to contact to report any reactions. These symptoms may be an emergency. Do not wait to see if the symptoms will go away. Get medical help right away. Call your local emergency services (911 in the [place] Do not drive yourself to the hospital. Summary  Bruising and soreness at the IV site are common.  Check your insertion site every day for signs of infection.  Rest as told by your doctor. Go back to your normal activities as told by your doctor.  Get help right away if you have signs of a serious reaction. This information is not intended to replace advice given to you by your health care provider. Make sure you discuss any questions you have with your health care provider. Document Revised: 05/11/2019 Document Reviewed: 05/11/2019 Elsevier Patient Education  2020 Elsevier Inc.  

## 2020-03-15 NOTE — Progress Notes (Signed)
Dr. Benay Spice wants patient to receive IVF today and repeat serum creatinine. May require renal US if creatinine is still high. Notified his girlfriend to bring him at 1145 for labs. She feels he can't stay too long due to COVID vaccine in [place] at 4 pm today. Suggested to her that his renal function is a higher priority today.

## 2020-03-18 ENCOUNTER — Telehealth: Payer: Self-pay | Admitting: *Deleted

## 2020-03-18 ENCOUNTER — Encounter: Payer: Self-pay | Admitting: *Deleted

## 2020-03-18 LAB — TYPE AND SCREEN
ABO/RH(D): A NEG
Antibody Screen: NEGATIVE
Unit division: 0

## 2020-03-18 LAB — BPAM RBC
Blood Product Expiration Date: 202104252359
ISSUE DATE / TIME: 202104161242
Unit Type and Rh: 600

## 2020-03-18 NOTE — Progress Notes (Signed)
Dr. Benay Spice was able to speak w/Dr. Frances Furbish, urology at [facility] who placed his ureteral stents. They will see him this week.

## 2020-03-18 NOTE — Progress Notes (Signed)
Symptoms Management Clinic Progress Note   ABAS SGRO [id number] 06-19-1956 64 y.o.  Clinton Cordova is managed by Dr. Dominica Severin B. Sherrill  Actively treated with chemotherapy/immunotherapy/hormonal therapy: Chemotherapy start pending  Next scheduled appointment with provider: 03/19/2020  Assessment: Plan:    No diagnosis found.   Metastatic rectal cancer with liver metastasis: The patient presents to the clinic today prior to plans to begin chemotherapy on 03/19/2020.  Acute renal failure: The patient's chemistry panel returned today with a creatinine of 2.92.  He was referred for a renal ultrasound today which returned showing:  FINDINGS: Right Kidney:  Renal measurements: 12.0 x 4.5 x 5.3 cm = volume: 150 mL. Echogenicity within normal limits. Moderate right hydronephrosis with formed double-J ureteral stent pigtail in the renal pelvis.  Left Kidney:  Renal measurements: 12.9 x 5.5 x 6.9 cm = volume: 256 mL. Echogenicity within normal limits. Moderate left hydronephrosis with formed double-J ureteral stent pigtail in the renal pelvis.  Bladder:  Appears normal for degree of bladder distention. Formed pigtails in the bladder.  Other:  None.  IMPRESSION: Moderate bilateral hydronephrosis with formed double-J ureteral stents in position bilaterally, degree of hydronephrosis in keeping with findings of prior CT dated 02/23/2020.  A creatinine will be collected at his return. He was told to push fluids. He may require to be referred back to his Urologist (Dr. Frances Furbish) at [facility] if his creatinine continues to be elevated.   Symptomatic anemia: The patient's hemoglobin returned at 7.5 yesterday  He is receiving a transfusion of 1 unit of packed red blood cells today.  Please see After Visit Summary for patient specific instructions.  Future Appointments  Date Time Provider [place]  03/19/2020 10:45 AM Owens Shark, NP [facility] None  03/19/2020  11:45 AM [facility] None    No orders of the defined types were placed in this encounter.      Subjective:   Patient ID:  Clinton Cordova is a 64 y.o. (DOB 02-Aug-1956) male.  Chief Complaint:  No chief complaint on file.   HPI Clinton Cordova  is a 64 y.o. male with a diagnosis of a recently diagnosed metastatic rectal cancer who was seen yesterday and is receiving a transfusion of one unit of red blood cells today. As documented yesterday, he has recently had surgery completed at [facility] in [place].  He was found to have liver metastasis.  His labs completed yesterday showed an elevated creatinine at 2.87 and anemia with a hemoglobin of 7.1.  On review of the patient's records from [facility] was noted that he had a elevation of his creatinine the evening before he was to be discharged.  He was kept an additional day was given extra IV fluids.  He has bilateral ureteral stents.  The recheck of his creatinine at that time showed improvement.  He has been eating and drinking well.  He reported fatigue but denied chest pain, shortness of breath, dyspnea on exertion, fevers, chills, sweats, nausea, or vomiting.  The output to his ostomy bag continues to be solid.  Medications: I have reviewed the patient's current medications.  Allergies: No Known Allergies  No past medical history on file.  Past Surgical History:  Procedure Laterality Date  . IR IMAGING GUIDED PORT INSERTION  03/14/2020    No family history on file.  Social History   Socioeconomic History  . Marital status: Single    Spouse name: Not on file  .  Number of children: Not on file  . Years of education: Not on file  . Highest education level: Not on file  Occupational History  . Not on file  Tobacco Use  . Smoking status: Former Research scientist (life sciences)  . Smokeless tobacco: Never Used  Substance and Sexual Activity  . Alcohol use: Not on file  . Drug use: Not on file  . Sexual activity: Not on  file  Other Topics Concern  . Not on file  Social History Narrative  . Not on file   Social Determinants of Health   Financial Resource Strain:   . Difficulty of Paying Living Expenses:   Food Insecurity:   . Worried About Charity fundraiser in the Last Year:   . Arboriculturist in the Last Year:   Transportation Needs:   . Film/video editor (Medical):   Marland Kitchen Lack of Transportation (Non-Medical):   Physical Activity:   . Days of Exercise per Week:   . Minutes of Exercise per Session:   Stress:   . Feeling of Stress :   Social Connections:   . Frequency of Communication with Friends and Family:   . Frequency of Social Gatherings with Friends and Family:   . Attends Religious Services:   . Active Member of Clubs or Organizations:   . Attends Archivist Meetings:   Marland Kitchen Marital Status:   Intimate Partner Violence:   . Fear of Current or Ex-Partner:   . Emotionally Abused:   Marland Kitchen Physically Abused:   . Sexually Abused:     Past Medical History, Surgical history, Social history, and Family history were reviewed and updated as appropriate.   Please see review of systems for further details on the patient's review from today.   Review of Systems:  Review of Systems  Constitutional: Positive for fatigue. Negative for chills, diaphoresis and fever.  HENT: Negative for trouble swallowing and voice change.   Respiratory: Negative for cough, chest tightness, shortness of breath and wheezing.   Cardiovascular: Negative for chest pain and palpitations.  Gastrointestinal: Negative for abdominal pain, constipation, diarrhea, nausea and vomiting.  Musculoskeletal: Negative for back pain and myalgias.  Neurological: Negative for dizziness, light-headedness and headaches.    Objective:   Physical Exam:  There were no vitals taken for this visit. ECOG: 0  Physical Exam Constitutional:      General: He is not in acute distress.    Appearance: He is not diaphoretic.    HENT:     Head: Normocephalic and atraumatic.  Neurological:     Mental Status: He is alert.     Coordination: Coordination normal.     Gait: Gait normal.  Psychiatric:        Mood and Affect: Mood normal.        Behavior: Behavior normal.        Thought Content: Thought content normal.        Judgment: Judgment normal.   Lungs clear Cardiac-regular rate and rhythm Abdomen-soft and nontender, lower abdomen drains  Lab Review:     Component Value Date/Time   NA 141 03/15/2020 1148   K 4.6 03/15/2020 1148   CL 113 (H) 03/15/2020 1148   CO2 17 (L) 03/15/2020 1148   GLUCOSE 104 (H) 03/15/2020 1148   BUN 28 (H) 03/15/2020 1148   CREATININE 2.92 (H) 03/15/2020 1148   CALCIUM 8.9 03/15/2020 1148   PROT 7.9 03/14/2020 1450   ALBUMIN 2.7 (L) 03/14/2020 1450   AST 7 (  L) 03/14/2020 1450   ALT 8 03/14/2020 1450   ALKPHOS 79 03/14/2020 1450   BILITOT <0.2 (L) 03/14/2020 1450   GFRNONAA 22 (L) 03/15/2020 1148   GFRAA 25 (L) 03/15/2020 1148       Component Value Date/Time   WBC 7.7 03/14/2020 1450   WBC 6.8 03/14/2020 1033   RBC 3.06 (L) 03/14/2020 1450   HGB 7.5 (L) 03/14/2020 1450   HCT 26.0 (L) 03/14/2020 1450   PLT 405 (H) 03/14/2020 1450   MCV 85.0 03/14/2020 1450   MCH 24.5 (L) 03/14/2020 1450   MCHC 28.8 (L) 03/14/2020 1450   RDW 16.4 (H) 03/14/2020 1450   LYMPHSABS 0.5 (L) 03/14/2020 1450   MONOABS 0.4 03/14/2020 1450   EOSABS 0.4 03/14/2020 1450   BASOSABS 0.0 03/14/2020 1450   -------------------------------  Imaging from last 24 hours (if applicable):  Radiology interpretation: US RENAL  Result Date: 03/15/2020 CLINICAL DATA:  Acute renal failure, rising creatinine EXAM: RENAL / URINARY TRACT ULTRASOUND COMPLETE COMPARISON:  CT abdomen pelvis, 02/23/2020 FINDINGS: Right Kidney: Renal measurements: 12.0 x 4.5 x 5.3 cm = volume: 150 mL. Echogenicity within normal limits. Moderate right hydronephrosis with formed double-J ureteral stent pigtail in the renal  pelvis. Left Kidney: Renal measurements: 12.9 x 5.5 x 6.9 cm = volume: 256 mL. Echogenicity within normal limits. Moderate left hydronephrosis with formed double-J ureteral stent pigtail in the renal pelvis. Bladder: Appears normal for degree of bladder distention. Formed pigtails in the bladder. Other: None. IMPRESSION: Moderate bilateral hydronephrosis with formed double-J ureteral stents in position bilaterally, degree of hydronephrosis in keeping with findings of prior CT dated 02/23/2020. These results will be called to the ordering clinician or representative by the Radiologist Assistant, and communication documented in the PACS or [employer]. Electronically Signed   By: Eddie Candle M.D.   On: 03/15/2020 16:11   IR IMAGING GUIDED PORT INSERTION  Result Date: 03/14/2020 CLINICAL DATA:  RECTAL CANCER, ACCESS FOR CHEMOTHERAPY EXAM: RIGHT INTERNAL JUGULAR SINGLE LUMEN POWER PORT CATHETER INSERTION Date:  03/14/2020 03/14/2020 11:59 am Radiologist:  M. Daryll Brod, MD Guidance:  Ultrasound and fluoroscopic MEDICATIONS: Ancef 2 g; The antibiotic was administered within an appropriate time interval prior to skin puncture. ANESTHESIA/SEDATION: Versed 2.0 mg IV; Fentanyl 100 mcg IV; Moderate Sedation Time:  20 minutes The patient was continuously monitored during the procedure by the interventional radiology nurse under my direct supervision. FLUOROSCOPY TIME:  0 minutes, 24 seconds (4 mGy) COMPLICATIONS: None immediate. CONTRAST:  None PROCEDURE: Informed consent was obtained from the patient following explanation of the procedure, risks, benefits and alternatives. The patient understands, agrees and consents for the procedure. All questions were addressed. A time out was performed. Maximal barrier sterile technique utilized including caps, mask, sterile gowns, sterile gloves, large sterile drape, hand hygiene, and 2% chlorhexidine scrub. Under sterile conditions and local anesthesia, right internal jugular  micropuncture venous access was performed. Access was performed with ultrasound. Images were obtained for documentation of the patent right internal jugular vein. A guide wire was inserted followed by a transitional dilator. This allowed insertion of a guide wire and catheter into the IVC. Measurements were obtained from the SVC / RA junction back to the right IJ venotomy site. In the right infraclavicular chest, a subcutaneous pocket was created over the second anterior rib. This was done under sterile conditions and local anesthesia. 1% lidocaine with epinephrine was utilized for this. A 2.5 cm incision was made in the skin. Blunt dissection was performed  to create a subcutaneous pocket over the right pectoralis major muscle. The pocket was flushed with saline vigorously. There was adequate hemostasis. The port catheter was assembled and checked for leakage. The port catheter was secured in the pocket with two retention sutures. The tubing was tunneled subcutaneously to the right venotomy site and inserted into the SVC/RA junction through a valved peel-away sheath. Position was confirmed with fluoroscopy. Images were obtained for documentation. The patient tolerated the procedure well. No immediate complications. Incisions were closed in a two layer fashion with 4 - 0 Vicryl suture. Dermabond was applied to the skin. The port catheter was accessed, blood was aspirated followed by saline and heparin flushes. Needle was removed. A dry sterile dressing was applied. IMPRESSION: Ultrasound and fluoroscopically guided right internal jugular single lumen power port catheter insertion. Tip in the SVC/RA junction. Catheter ready for use. Electronically Signed   By: Jerilynn Mages.  Shick M.D.   On: 03/14/2020 12:10        This patient was seen with Dr. Benay Spice with my treatment plan reviewed with him. He expressed agreement with my medical management of this patient. Mr. Konow was interviewed and examined.  The creatinine is  elevated.  He has bilateral hydronephrosis on ultrasound.  I will contact the urology service at [facility] to request exchange of the ureter stents.  Mr. Pannullo has severe anemia.  He denies bleeding.  The anemia is likely secondary to chronic disease and renal insufficiency.  He will return for an office visit next week.  We will place chemotherapy on hold until the renal insufficiency improves.  He was transfused with 1 unit of packed red blood cells today.  We will check a ferritin level when he is here next week.  Julieanne Manson, MD

## 2020-03-18 NOTE — Telephone Encounter (Signed)
Called to confirm he was a patient here w/diagnosis and date of diagnosis. Provided office information and tax ID #. Needed to confirm his cancer diagnosis to exempt him from a drug management program.

## 2020-03-19 ENCOUNTER — Other Ambulatory Visit: Payer: Self-pay

## 2020-03-19 ENCOUNTER — Inpatient Hospital Stay: Payer: Medicare Other

## 2020-03-19 ENCOUNTER — Inpatient Hospital Stay (HOSPITAL_BASED_OUTPATIENT_CLINIC_OR_DEPARTMENT_OTHER): Payer: Medicare Other | Admitting: Nurse Practitioner

## 2020-03-19 ENCOUNTER — Encounter: Payer: Self-pay | Admitting: Nurse Practitioner

## 2020-03-19 VITALS — HR 77 | Temp 98.5°F | Resp 20 | Ht 72.0 in | Wt 168.5 lb

## 2020-03-19 DIAGNOSIS — C20 Malignant neoplasm of rectum: Secondary | ICD-10-CM

## 2020-03-19 DIAGNOSIS — Z5111 Encounter for antineoplastic chemotherapy: Secondary | ICD-10-CM | POA: Diagnosis not present

## 2020-03-19 DIAGNOSIS — Z95828 Presence of other vascular implants and grafts: Secondary | ICD-10-CM | POA: Diagnosis not present

## 2020-03-19 LAB — CBC WITH DIFFERENTIAL (CANCER CENTER ONLY)
Abs Immature Granulocytes: 0.02 10*3/uL (ref 0.00–0.07)
Basophils Absolute: 0.1 10*3/uL (ref 0.0–0.1)
Basophils Relative: 1 %
Eosinophils Absolute: 0.6 10*3/uL — ABNORMAL HIGH (ref 0.0–0.5)
Eosinophils Relative: 11 %
HCT: 27.5 % — ABNORMAL LOW (ref 39.0–52.0)
Hemoglobin: 8.2 g/dL — ABNORMAL LOW (ref 13.0–17.0)
Immature Granulocytes: 0 %
Lymphocytes Relative: 18 %
Lymphs Abs: 1.1 10*3/uL (ref 0.7–4.0)
MCH: 24.6 pg — ABNORMAL LOW (ref 26.0–34.0)
MCHC: 29.8 g/dL — ABNORMAL LOW (ref 30.0–36.0)
MCV: 82.3 fL (ref 80.0–100.0)
Monocytes Absolute: 0.7 10*3/uL (ref 0.1–1.0)
Monocytes Relative: 12 %
Neutro Abs: 3.4 10*3/uL (ref 1.7–7.7)
Neutrophils Relative %: 58 %
Platelet Count: 386 10*3/uL (ref 150–400)
RBC: 3.34 MIL/uL — ABNORMAL LOW (ref 4.22–5.81)
RDW: 16.4 % — ABNORMAL HIGH (ref 11.5–15.5)
WBC Count: 5.9 10*3/uL (ref 4.0–10.5)
nRBC: 0 % (ref 0.0–0.2)

## 2020-03-19 LAB — CMP (CANCER CENTER ONLY)
ALT: 8 U/L (ref 0–44)
AST: 9 U/L — ABNORMAL LOW (ref 15–41)
Albumin: 2.8 g/dL — ABNORMAL LOW (ref 3.5–5.0)
Alkaline Phosphatase: 77 U/L (ref 38–126)
Anion gap: 10 (ref 5–15)
BUN: 23 mg/dL (ref 8–23)
CO2: 18 mmol/L — ABNORMAL LOW (ref 22–32)
Calcium: 9.1 mg/dL (ref 8.9–10.3)
Chloride: 111 mmol/L (ref 98–111)
Creatinine: 1.96 mg/dL — ABNORMAL HIGH (ref 0.61–1.24)
GFR, Est AFR Am: 40 mL/min — ABNORMAL LOW (ref >60)
GFR, Estimated: 35 mL/min — ABNORMAL LOW (ref >60)
Glucose, Bld: 78 mg/dL (ref 70–99)
Potassium: 4.8 mmol/L (ref 3.5–5.1)
Sodium: 139 mmol/L (ref 135–145)
Total Bilirubin: <0.2 mg/dL — ABNORMAL LOW (ref 0.3–1.2)
Total Protein: 8 g/dL (ref 6.5–8.1)

## 2020-03-19 LAB — FERRITIN: Ferritin: 161 ng/mL (ref 24–336)

## 2020-03-19 MED ORDER — HEPARIN SOD (PORK) LOCK FLUSH 100 UNIT/ML IV SOLN
500.0000 [IU] | Freq: Once | INTRAVENOUS | Status: AC
  Administered 2020-03-19: 500 [IU] via INTRAVENOUS
  Filled 2020-03-19: qty 5

## 2020-03-19 MED ORDER — SODIUM CHLORIDE 0.9% FLUSH
10.0000 mL | INTRAVENOUS | Status: DC | PRN
  Administered 2020-03-19: 10 mL via INTRAVENOUS
  Filled 2020-03-19: qty 10

## 2020-03-19 NOTE — Progress Notes (Addendum)
[place] OFFICE PROGRESS NOTE   Diagnosis: Rectal cancer  INTERVAL HISTORY:   Clinton Cordova returns as scheduled.  Renal ultrasound on 03/15/2020 showed moderate bilateral hydronephrosis with ureteral stents in place.  He is feeling much better since the blood transfusion.  He noted marked improvement in his energy level.  He denies bleeding.  He has increased urination, mild dysuria.  Objective:  Vital signs in last 24 hours:  Pulse 77, temperature 98.5 F (36.9 C), temperature source Temporal, resp. rate 20, height 6' (1.829 m), weight 168 lb 8 oz (76.4 kg), SpO2 100 %.    GI: No hepatomegaly.  Left lower quadrant colostomy.  Lower abdomen drains in place. Vascular: No leg edema. Port-A-Cath without erythema.  Lab Results:  Lab Results  Component Value Date   WBC 5.9 03/19/2020   HGB 8.2 (L) 03/19/2020   HCT 27.5 (L) 03/19/2020   MCV 82.3 03/19/2020   PLT 386 03/19/2020   NEUTROABS 3.4 03/19/2020    Imaging:  No results found.  Medications: I have reviewed the patient's current medications.  Assessment/Plan: 1. Metastatic rectal cancer, perforated rectal tumor with abscess, stage IV ? Presented with "diverticulitis "March 2020, status post drainage catheter ? Colonoscopy 10/17/2019-obstructing mass at 10 cm, biopsy confirmed adenocarcinoma ? MRI pelvis 10/22/2019-T3 versus T4, into, M0 lesion-evaluation limited due to abscess ? Neoadjuvant radiation-short course ? 01/17/2020-palliative low anterior resection, end colostomy, biopsy of peritoneal nodules, liver biopsy, left ureter stent, right ureter reimplantation, placement of pelvic drains  Operative findings included chronic perforation of the rectum with involvement of the pelvic sidewalls and bladder, 2 peritoneal nodules at the sacral promontory, 1 cm mass at segment 4 of the liver  Pathology confirmed a ypT4n, pN1c,M1c, moderately differentiated adenocarcinoma, lymphovascular and perineural  invasion present, treatment response score 2, positive distal and radial margins, 0/15 lymph nodes, 1 tumor deposit, no loss of mismatch repair protein expression, MSS, positive for adenocarcinoma involving the right ureter, segment 4 liver, and peritoneal deposits  CTs chest, abdomen, and pelvis 02/23/2020-2 pelvic surgical drains, 2.1 cm hepatic dome hypodensity, 1.8 cm peripheral segment 6/7 lesion, bilateral ureter stents, mild left hydroureteronephrosis, 2 new right lower lobe 0.3 cm nodules-indeterminate  2.   Diverticulitis/perforated rectal tumor-pelvic drains remain in place  3.   Coronary artery disease  4.   Chronic systolic heart failure  5.   History of an LV thrombus, status post course of Eliquis, no thrombus visualized on echocardiogram 09/15/2019, LVEF 45-50 6.   Renal ultrasound 03/15/2020-moderate bilateral hydronephrosis with ureteral stents in position bilaterally--has appointment with Dr. Lottie Rater, urology at [facility] 03/22/2020   Disposition: Clinton Cordova appears stable.  Recent renal ultrasound showed moderate bilateral hydronephrosis with bilateral stents in place.  He has an appointment with Dr. Lottie Rater, urology at [facility] this Friday for stent exchange.  Of note, creatinine is better on labs today.  We will cancel planned chemotherapy for today and reschedule to next week.  We reviewed the CBC from today.  Hemoglobin is better.  We will follow-up on the ferritin.  He will return for lab, follow-up, cycle 1 FOLFOX on 03/28/2020.  He will contact the office in the interim with any problems.  Patient seen with Dr. Benay Spice.  Ned Card ANP/GNP-BC   03/19/2020  11:20 AM  This was a shared visit with Ned Card.  Clinton Cordova appears well.  The ultrasound on 03/15/2020 revealed bilateral hydronephrosis with stents in place.  I discussed the case with Dr. Lottie Rater.  He will see Clinton Cordova this week for exchange of the urinary stents.  Cycle 1 FOLFOX will be delayed until next week. We  will monitor the hemoglobin and arrange for a repeat transfusion as needed.  Julieanne Manson, MD

## 2020-03-25 ENCOUNTER — Telehealth: Payer: Self-pay | Admitting: Oncology

## 2020-03-25 NOTE — Telephone Encounter (Signed)
Scheduled per los. Called and spoke with patients girlfriend. Confirmed appt

## 2020-03-28 ENCOUNTER — Inpatient Hospital Stay: Payer: Medicare Other

## 2020-03-28 ENCOUNTER — Other Ambulatory Visit: Payer: Self-pay

## 2020-03-28 ENCOUNTER — Encounter: Payer: Self-pay | Admitting: Nurse Practitioner

## 2020-03-28 ENCOUNTER — Inpatient Hospital Stay (HOSPITAL_BASED_OUTPATIENT_CLINIC_OR_DEPARTMENT_OTHER): Payer: Medicare Other | Admitting: Nurse Practitioner

## 2020-03-28 VITALS — BP 149/71 | HR 73 | Temp 98.7°F | Resp 16 | Ht 72.0 in | Wt 169.9 lb

## 2020-03-28 DIAGNOSIS — C20 Malignant neoplasm of rectum: Secondary | ICD-10-CM

## 2020-03-28 DIAGNOSIS — Z5111 Encounter for antineoplastic chemotherapy: Secondary | ICD-10-CM | POA: Diagnosis not present

## 2020-03-28 DIAGNOSIS — Z95828 Presence of other vascular implants and grafts: Secondary | ICD-10-CM

## 2020-03-28 LAB — CBC WITH DIFFERENTIAL (CANCER CENTER ONLY)
Abs Immature Granulocytes: 0.02 10*3/uL (ref 0.00–0.07)
Basophils Absolute: 0.1 10*3/uL (ref 0.0–0.1)
Basophils Relative: 1 %
Eosinophils Absolute: 0.6 10*3/uL — ABNORMAL HIGH (ref 0.0–0.5)
Eosinophils Relative: 8 %
HCT: 28 % — ABNORMAL LOW (ref 39.0–52.0)
Hemoglobin: 8.2 g/dL — ABNORMAL LOW (ref 13.0–17.0)
Immature Granulocytes: 0 %
Lymphocytes Relative: 14 %
Lymphs Abs: 1.1 10*3/uL (ref 0.7–4.0)
MCH: 24.5 pg — ABNORMAL LOW (ref 26.0–34.0)
MCHC: 29.3 g/dL — ABNORMAL LOW (ref 30.0–36.0)
MCV: 83.6 fL (ref 80.0–100.0)
Monocytes Absolute: 0.7 10*3/uL (ref 0.1–1.0)
Monocytes Relative: 9 %
Neutro Abs: 5.3 10*3/uL (ref 1.7–7.7)
Neutrophils Relative %: 68 %
Platelet Count: 426 10*3/uL — ABNORMAL HIGH (ref 150–400)
RBC: 3.35 MIL/uL — ABNORMAL LOW (ref 4.22–5.81)
RDW: 16.5 % — ABNORMAL HIGH (ref 11.5–15.5)
WBC Count: 7.7 10*3/uL (ref 4.0–10.5)
nRBC: 0 % (ref 0.0–0.2)

## 2020-03-28 LAB — CMP (CANCER CENTER ONLY)
ALT: 10 U/L (ref 0–44)
AST: 12 U/L — ABNORMAL LOW (ref 15–41)
Albumin: 3 g/dL — ABNORMAL LOW (ref 3.5–5.0)
Alkaline Phosphatase: 81 U/L (ref 38–126)
Anion gap: 11 (ref 5–15)
BUN: 20 mg/dL (ref 8–23)
CO2: 22 mmol/L (ref 22–32)
Calcium: 9.4 mg/dL (ref 8.9–10.3)
Chloride: 109 mmol/L (ref 98–111)
Creatinine: 1.5 mg/dL — ABNORMAL HIGH (ref 0.61–1.24)
GFR, Est AFR Am: 56 mL/min — ABNORMAL LOW (ref >60)
GFR, Estimated: 48 mL/min — ABNORMAL LOW (ref >60)
Glucose, Bld: 97 mg/dL (ref 70–99)
Potassium: 4.3 mmol/L (ref 3.5–5.1)
Sodium: 142 mmol/L (ref 135–145)
Total Bilirubin: <0.2 mg/dL — ABNORMAL LOW (ref 0.3–1.2)
Total Protein: 7.9 g/dL (ref 6.5–8.1)

## 2020-03-28 MED ORDER — FLUOROURACIL CHEMO INJECTION 2.5 GM/50ML
400.0000 mg/m2 | Freq: Once | INTRAVENOUS | Status: AC
  Administered 2020-03-28: 800 mg via INTRAVENOUS
  Filled 2020-03-28: qty 16

## 2020-03-28 MED ORDER — SODIUM CHLORIDE 0.9 % IV SOLN
10.0000 mg | Freq: Once | INTRAVENOUS | Status: AC
  Administered 2020-03-28: 10 mg via INTRAVENOUS
  Filled 2020-03-28: qty 10

## 2020-03-28 MED ORDER — PALONOSETRON HCL INJECTION 0.25 MG/5ML
0.2500 mg | Freq: Once | INTRAVENOUS | Status: AC
  Administered 2020-03-28: 0.25 mg via INTRAVENOUS

## 2020-03-28 MED ORDER — SODIUM CHLORIDE 0.9 % IV SOLN
2400.0000 mg/m2 | INTRAVENOUS | Status: DC
  Administered 2020-03-28: 4650 mg via INTRAVENOUS
  Filled 2020-03-28: qty 93

## 2020-03-28 MED ORDER — PALONOSETRON HCL INJECTION 0.25 MG/5ML
INTRAVENOUS | Status: AC
  Filled 2020-03-28: qty 5

## 2020-03-28 MED ORDER — DEXTROSE 5 % IV SOLN
Freq: Once | INTRAVENOUS | Status: AC
  Filled 2020-03-28: qty 250

## 2020-03-28 MED ORDER — LEUCOVORIN CALCIUM INJECTION 350 MG
400.0000 mg/m2 | Freq: Once | INTRAVENOUS | Status: AC
  Administered 2020-03-28: 776 mg via INTRAVENOUS
  Filled 2020-03-28: qty 38.8

## 2020-03-28 MED ORDER — OXALIPLATIN CHEMO INJECTION 100 MG/20ML
85.0000 mg/m2 | Freq: Once | INTRAVENOUS | Status: AC
  Administered 2020-03-28: 165 mg via INTRAVENOUS
  Filled 2020-03-28: qty 33

## 2020-03-28 NOTE — Progress Notes (Addendum)
  [place] OFFICE PROGRESS NOTE   Diagnosis: Rectal cancer  INTERVAL HISTORY:   Mr. Grill returns as scheduled.  Overall he is feeling well.  He reports a good appetite.  No pain.  No nausea or vomiting.  Colostomy functioning normally.  Objective:  Vital signs in last 24 hours:  Blood pressure (!) 149/71, pulse 73, temperature 98.7 F (37.1 C), temperature source Temporal, resp. rate 16, height 6' (1.829 m), weight 169 lb 14.4 oz (77.1 kg), SpO2 100 %.    GI: No hepatomegaly.  Left lower quadrant colostomy.  2 low abdominal drains. Vascular: No leg edema. Neuro: Alert and oriented. Skin: Palms without erythema. Port-A-Cath without erythema.   Lab Results:  Lab Results  Component Value Date   WBC 7.7 03/28/2020   HGB 8.2 (L) 03/28/2020   HCT 28.0 (L) 03/28/2020   MCV 83.6 03/28/2020   PLT 426 (H) 03/28/2020   NEUTROABS 5.3 03/28/2020    Imaging:  No results found.  Medications: I have reviewed the patient's current medications.  Assessment/Plan: 1. Metastatic rectal cancer, perforated rectal tumor with abscess, stage IV ? Presented with "diverticulitis "March 2020, status post drainage catheter ? Colonoscopy 10/17/2019-obstructing mass at 10 cm, biopsy confirmed adenocarcinoma ? MRI pelvis 10/22/2019-T3 versus T4, into, M0 lesion-evaluation limited due to abscess ? Neoadjuvant radiation-short course ? 01/17/2020-palliative low anterior resection, end colostomy, biopsy of peritoneal nodules, liver biopsy, left ureter stent, right ureter reimplantation, placement of pelvic drains  Operative findings included chronic perforation of the rectum with involvement of the pelvic sidewalls and bladder, 2 peritoneal nodules at the sacral promontory, 1 cm mass at segment 4 of the liver  Pathology confirmeda ypT4n, pN1c,M1c,moderately differentiated adenocarcinoma, lymphovascular and perineural invasion present, treatment response score 2, positive distal  and radial margins, 0/15 lymph nodes, 1 tumor deposit, no loss of mismatch repair protein expression, MSS, positive for adenocarcinoma involving the right ureter, segment 4 liver, and peritoneal deposits  CTs chest, abdomen, and pelvis 02/23/2020-2 pelvic surgical drains, 2.1 cm hepatic dome hypodensity, 1.8 cm peripheral segment 6/7 lesion, bilateral ureter stents, mild left hydroureteronephrosis, 2 new right lower lobe 0.3 cm nodules-indeterminate  Cycle 1 FOLFOX 03/28/2020 2.Diverticulitis/perforated rectal tumor-pelvic drains remain in place 3.Coronary artery disease 4.Chronic systolic heart failure 5.History of anLV thrombus, status post course of Eliquis, no thrombus visualized on echocardiogram 09/15/2019, LVEF 45-50 6.         Renal ultrasound 03/15/2020-moderate bilateral hydronephrosis with ureteral stents in position bilaterally--bilateral stent exchange Dr. Lottie Rater, urology at [facility] 03/22/2020    Disposition: Mr. Kimball appears stable.  Plan to proceed with cycle 1 FOLFOX today as scheduled.  We reviewed the CBC and chemistry panel from today.  Labs are adequate for treatment.  Kidney function has improved since the recent bilateral ureteral stent exchange.  He will return for lab, follow-up, cycle 2 FOLFOX in 2 weeks.  He will contact the office in the interim with any problems.  Patient seen with Dr. Benay Spice.  Ned Card ANP/GNP-BC   03/28/2020  10:55 AM  This was a shared visit with Ned Card.  Mr. Kolenovic underwent exchange of the ureter stents last week.  The creatinine has improved.  Pelvic drains remain in place as recommended by Dr. Barbaraann Cao.  The plan is to proceed with cycle 1 FOLFOX today.  Julieanne Manson, MD

## 2020-03-28 NOTE — Patient Instructions (Signed)
[facility] °Discharge Instructions for Patients Receiving Chemotherapy ° °Today you received the following chemotherapy agents Oxaliplatin (ELOXATIN), Leucovorin & Flourouracil (ADRUCIL). ° °To help prevent nausea and vomiting after your treatment, we encourage you to take your nausea medication as prescribed. °  °If you develop nausea and vomiting that is not controlled by your nausea medication, call the clinic.  ° °BELOW ARE SYMPTOMS THAT SHOULD BE REPORTED IMMEDIATELY: °· *FEVER GREATER THAN 100.5 F °· *CHILLS WITH OR WITHOUT FEVER °· NAUSEA AND VOMITING THAT IS NOT CONTROLLED WITH YOUR NAUSEA MEDICATION °· *UNUSUAL SHORTNESS OF BREATH °· *UNUSUAL BRUISING OR BLEEDING °· TENDERNESS IN MOUTH AND THROAT WITH OR WITHOUT PRESENCE OF ULCERS °· *URINARY PROBLEMS °· *BOWEL PROBLEMS °· UNUSUAL RASH °Items with * indicate a potential emergency and should be followed up as soon as possible. ° °Feel free to call the clinic should you have any questions or concerns. The clinic phone number is [phone]. ° °Please show the CHEMO ALERT CARD at check-in to the Emergency Department and triage nurse. ° °Oxaliplatin Injection °What is this medicine? °OXALIPLATIN (ox AL i PLA tin) is a chemotherapy drug. It targets fast dividing cells, like cancer cells, and causes these cells to die. This medicine is used to treat cancers of the colon and rectum, and many other cancers. °This medicine may be used for other purposes; ask your health care provider or pharmacist if you have questions. °COMMON BRAND NAME(S): Eloxatin °What should I tell my health care provider before I take this medicine? °They need to know if you have any of these conditions: °· heart disease °· history of irregular heartbeat °· liver disease °· low blood counts, like white cells, platelets, or red blood cells °· lung or breathing disease, like asthma °· take medicines that treat or prevent blood clots °· tingling of the fingers or toes, or other  nerve disorder °· an unusual or allergic reaction to oxaliplatin, other chemotherapy, other medicines, foods, dyes, or preservatives °· pregnant or trying to get pregnant °· breast-feeding °How should I use this medicine? °This drug is given as an infusion into a vein. It is administered in a hospital or clinic by a specially trained health care professional. °Talk to your pediatrician regarding the use of this medicine in children. Special care may be needed. °Overdosage: If you think you have taken too much of this medicine contact a poison control center or emergency room at once. °NOTE: This medicine is only for you. Do not share this medicine with others. °What if I miss a dose? °It is important not to miss a dose. Call your doctor or health care professional if you are unable to keep an appointment. °What may interact with this medicine? °Do not take this medicine with any of the following medications: °· cisapride °· dronedarone °· pimozide °· thioridazine °This medicine may also interact with the following medications: °· aspirin and aspirin-like medicines °· certain medicines that treat or prevent blood clots like warfarin, apixaban, dabigatran, and rivaroxaban °· cisplatin °· cyclosporine °· diuretics °· medicines for infection like acyclovir, adefovir, amphotericin B, bacitracin, cidofovir, foscarnet, ganciclovir, gentamicin, pentamidine, vancomycin °· NSAIDs, medicines for pain and inflammation, like ibuprofen or naproxen °· other medicines that prolong the QT interval (an abnormal heart rhythm) °· pamidronate °· zoledronic acid °This list may not describe all possible interactions. Give your health care provider a list of all the medicines, herbs, non-prescription drugs, or dietary supplements you use. Also tell them if you   smoke, drink alcohol, or use illegal drugs. Some items may interact with your medicine. °What should I watch for while using this medicine? °Your condition will be monitored  carefully while you are receiving this medicine. °You may need blood work done while you are taking this medicine. °This medicine may make you feel generally unwell. This is not uncommon as chemotherapy can affect healthy cells as well as cancer cells. Report any side effects. Continue your course of treatment even though you feel ill unless your healthcare professional tells you to stop. °This medicine can make you more sensitive to cold. Do not drink cold drinks or use ice. Cover exposed skin before coming in contact with cold temperatures or cold objects. When out in cold weather wear warm clothing and cover your mouth and nose to warm the air that goes into your lungs. Tell your doctor if you get sensitive to the cold. °Do not become pregnant while taking this medicine or for 9 months after stopping it. Women should inform their health care professional if they wish to become pregnant or think they might be pregnant. Men should not father a child while taking this medicine and for 6 months after stopping it. There is potential for serious side effects to an unborn child. Talk to your health care professional for more information. °Do not breast-feed a child while taking this medicine or for 3 months after stopping it. °This medicine has caused ovarian failure in some women. This medicine may make it more difficult to get pregnant. Talk to your health care professional if you are concerned about your fertility. °This medicine has caused decreased sperm counts in some men. This may make it more difficult to father a child. Talk to your health care professional if you are concerned about your fertility. °This medicine may increase your risk of getting an infection. Call your health care professional for advice if you get a fever, chills, or sore throat, or other symptoms of a cold or flu. Do not treat yourself. Try to avoid being around people who are sick. °Avoid taking medicines that contain aspirin,  acetaminophen, ibuprofen, naproxen, or ketoprofen unless instructed by your health care professional. These medicines may hide a fever. °Be careful brushing or flossing your teeth or using a toothpick because you may get an infection or bleed more easily. If you have any dental work done, tell your dentist you are receiving this medicine. °What side effects may I notice from receiving this medicine? °Side effects that you should report to your doctor or health care professional as soon as possible: °· allergic reactions like skin rash, itching or hives, swelling of the face, lips, or tongue °· breathing problems °· cough °· low blood counts - this medicine may decrease the number of white blood cells, red blood cells, and platelets. You may be at increased risk for infections and bleeding °· nausea, vomiting °· pain, redness, or irritation at site where injected °· pain, tingling, numbness in the hands or feet °· signs and symptoms of bleeding such as bloody or black, tarry stools; red or dark brown urine; spitting up blood or brown material that looks like coffee grounds; red spots on the skin; unusual bruising or bleeding from the eyes, gums, or nose °· signs and symptoms of a dangerous change in heartbeat or heart rhythm like chest pain; dizziness; fast, irregular heartbeat; palpitations; feeling faint or lightheaded; falls °· signs and symptoms of infection like fever; chills; cough; sore throat; pain or trouble   passing urine °· signs and symptoms of liver injury like dark yellow or brown urine; general ill feeling or flu-like symptoms; light-colored stools; loss of appetite; nausea; right upper belly pain; unusually weak or tired; yellowing of the eyes or skin °· signs and symptoms of low red blood cells or anemia such as unusually weak or tired; feeling faint or lightheaded; falls °· signs and symptoms of muscle injury like dark urine; trouble passing urine or change in the amount of urine; unusually weak or  tired; muscle pain; back pain °Side effects that usually do not require medical attention (report to your doctor or health care professional if they continue or are bothersome): °· changes in taste °· diarrhea °· gas °· hair loss °· loss of appetite °· mouth sores °This list may not describe all possible side effects. Call your doctor for medical advice about side effects. You may report side effects to FDA at 1-800-FDA-1088. °Where should I keep my medicine? °This drug is given in a hospital or clinic and will not be stored at home. °NOTE: This sheet is a summary. It may not cover all possible information. If you have questions about this medicine, talk to your doctor, pharmacist, or health care provider. °© 2020 [employer] (2019-04-05 12:20:35) ° °Leucovorin injection °What is this medicine? °LEUCOVORIN (loo koe VOR in) is used to prevent or treat the harmful effects of some medicines. This medicine is used to treat anemia caused by a low amount of folic acid in the body. It is also used with 5-fluorouracil (5-FU) to treat colon cancer. °This medicine may be used for other purposes; ask your health care provider or pharmacist if you have questions. °What should I tell my health care provider before I take this medicine? °They need to know if you have any of these conditions: °· anemia from low levels of vitamin B-12 in the blood °· an unusual or allergic reaction to leucovorin, folic acid, other medicines, foods, dyes, or preservatives °· pregnant or trying to get pregnant °· breast-feeding °How should I use this medicine? °This medicine is for injection into a muscle or into a vein. It is given by a health care professional in a hospital or clinic setting. °Talk to your pediatrician regarding the use of this medicine in children. Special care may be needed. °Overdosage: If you think you have taken too much of this medicine contact a poison control center or emergency room at once. °NOTE: This medicine  is only for you. Do not share this medicine with others. °What if I miss a dose? °This does not apply. °What may interact with this medicine? °· capecitabine °· fluorouracil °· phenobarbital °· phenytoin °· primidone °· trimethoprim-sulfamethoxazole °This list may not describe all possible interactions. Give your health care provider a list of all the medicines, herbs, non-prescription drugs, or dietary supplements you use. Also tell them if you smoke, drink alcohol, or use illegal drugs. Some items may interact with your medicine. °What should I watch for while using this medicine? °Your condition will be monitored carefully while you are receiving this medicine. °This medicine may increase the side effects of 5-fluorouracil, 5-FU. Tell your doctor or health care professional if you have diarrhea or mouth sores that do not get better or that get worse. °What side effects may I notice from receiving this medicine? °Side effects that you should report to your doctor or health care professional as soon as possible: °· allergic reactions like skin rash, itching or hives, swelling   of the face, lips, or tongue °· breathing problems °· fever, infection °· mouth sores °· unusual bleeding or bruising °· unusually weak or tired °Side effects that usually do not require medical attention (report to your doctor or health care professional if they continue or are bothersome): °· constipation or diarrhea °· loss of appetite °· nausea, vomiting °This list may not describe all possible side effects. Call your doctor for medical advice about side effects. You may report side effects to FDA at 1-800-FDA-1088. °Where should I keep my medicine? °This drug is given in a hospital or clinic and will not be stored at home. °NOTE: This sheet is a summary. It may not cover all possible information. If you have questions about this medicine, talk to your doctor, pharmacist, or health care provider. °© 2020 [employer] Standard (2008-05-22  16:50:29) ° °Fluorouracil, 5-FU injection °What is this medicine? °FLUOROURACIL, 5-FU (flure oh YOOR a sil) is a chemotherapy drug. It slows the growth of cancer cells. This medicine is used to treat many types of cancer like breast cancer, colon or rectal cancer, pancreatic cancer, and stomach cancer. °This medicine may be used for other purposes; ask your health care provider or pharmacist if you have questions. °COMMON BRAND NAME(S): Adrucil °What should I tell my health care provider before I take this medicine? °They need to know if you have any of these conditions: °· blood disorders °· dihydropyrimidine dehydrogenase (DPD) deficiency °· infection (especially a virus infection such as chickenpox, cold sores, or herpes) °· kidney disease °· liver disease °· malnourished, poor nutrition °· recent or ongoing radiation therapy °· an unusual or allergic reaction to fluorouracil, other chemotherapy, other medicines, foods, dyes, or preservatives °· pregnant or trying to get pregnant °· breast-feeding °How should I use this medicine? °This drug is given as an infusion or injection into a vein. It is administered in a hospital or clinic by a specially trained health care professional. °Talk to your pediatrician regarding the use of this medicine in children. Special care may be needed. °Overdosage: If you think you have taken too much of this medicine contact a poison control center or emergency room at once. °NOTE: This medicine is only for you. Do not share this medicine with others. °What if I miss a dose? °It is important not to miss your dose. Call your doctor or health care professional if you are unable to keep an appointment. °What may interact with this medicine? °· allopurinol °· cimetidine °· dapsone °· digoxin °· hydroxyurea °· leucovorin °· levamisole °· medicines for seizures like ethotoin, fosphenytoin, phenytoin °· medicines to increase blood counts like filgrastim, pegfilgrastim,  sargramostim °· medicines that treat or prevent blood clots like warfarin, enoxaparin, and dalteparin °· methotrexate °· metronidazole °· pyrimethamine °· some other chemotherapy drugs like busulfan, cisplatin, estramustine, vinblastine °· trimethoprim °· trimetrexate °· vaccines °Talk to your doctor or health care professional before taking any of these medicines: °· acetaminophen °· aspirin °· ibuprofen °· ketoprofen °· naproxen °This list may not describe all possible interactions. Give your health care provider a list of all the medicines, herbs, non-prescription drugs, or dietary supplements you use. Also tell them if you smoke, drink alcohol, or use illegal drugs. Some items may interact with your medicine. °What should I watch for while using this medicine? °Visit your doctor for checks on your progress. This drug may make you feel generally unwell. This is not uncommon, as chemotherapy can affect healthy cells as well as cancer cells.   Report any side effects. Continue your course of treatment even though you feel ill unless your doctor tells you to stop. In some cases, you may be given additional medicines to help with side effects. Follow all directions for their use. Call your doctor or health care professional for advice if you get a fever, chills or sore throat, or other symptoms of a cold or flu. Do not treat yourself. This drug decreases your body's ability to fight infections. Try to avoid being around people who are sick. This medicine may increase your risk to bruise or bleed. Call your doctor or health care professional if you notice any unusual bleeding. Be careful brushing and flossing your teeth or using a toothpick because you may get an infection or bleed more easily. If you have any dental work done, tell your dentist you are receiving this medicine. Avoid taking products that contain aspirin, acetaminophen, ibuprofen, naproxen, or ketoprofen unless instructed by your doctor. These  medicines may hide a fever. Do not become pregnant while taking this medicine. Women should inform their doctor if they wish to become pregnant or think they might be pregnant. There is a potential for serious side effects to an unborn child. Talk to your health care professional or pharmacist for more information. Do not breast-feed an infant while taking this medicine. Men should inform their doctor if they wish to father a child. This medicine may lower sperm counts. Do not treat diarrhea with over the counter products. Contact your doctor if you have diarrhea that lasts more than 2 days or if it is severe and watery. This medicine can make you more sensitive to the sun. Keep out of the sun. If you cannot avoid being in the sun, wear protective clothing and use sunscreen. Do not use sun lamps or tanning beds/booths. What side effects may I notice from receiving this medicine? Side effects that you should report to your doctor or health care professional as soon as possible:  allergic reactions like skin rash, itching or hives, swelling of the face, lips, or tongue  low blood counts - this medicine may decrease the number of white blood cells, red blood cells and platelets. You may be at increased risk for infections and bleeding.  signs of infection - fever or chills, cough, sore throat, pain or difficulty passing urine  signs of decreased platelets or bleeding - bruising, pinpoint red spots on the skin, black, tarry stools, blood in the urine  signs of decreased red blood cells - unusually weak or tired, fainting spells, lightheadedness  breathing problems  changes in vision  chest pain  mouth sores  nausea and vomiting  pain, swelling, redness at site where injected  pain, tingling, numbness in the hands or feet  redness, swelling, or sores on hands or feet  stomach pain  unusual bleeding Side effects that usually do not require medical attention (report to your doctor or  health care professional if they continue or are bothersome):  changes in finger or toe nails  diarrhea  dry or itchy skin  hair loss  headache  loss of appetite  sensitivity of eyes to the light  stomach upset  unusually teary eyes This list may not describe all possible side effects. Call your doctor for medical advice about side effects. You may report side effects to FDA at 1-800-FDA-1088. Where should I keep my medicine? This drug is given in a hospital or clinic and will not be stored at home. NOTE: This sheet  is a summary. It may not cover all possible information. If you have questions about this medicine, talk to your doctor, pharmacist, or health care provider. °© 2020 [employer] (2008-03-21 13:53:16) ° °

## 2020-03-29 ENCOUNTER — Telehealth: Payer: Self-pay | Admitting: *Deleted

## 2020-03-29 ENCOUNTER — Telehealth: Payer: Self-pay | Admitting: Oncology

## 2020-03-29 NOTE — Telephone Encounter (Signed)
-----   Message from Georgianne Fick, RN sent at 03/28/2020  3:27 PM EDT ----- Regarding: Dr. Benay Spice First Time Chemo. Pt. received first time Folfox today and tolerated this well.

## 2020-03-29 NOTE — Telephone Encounter (Signed)
Called & spoke with pt's wife to see how pt did with chemo treatment yest.  She states that he did well last night & no problems this am.  She is working & will keep in touch with him.  He will be back in Sat to remove pump.  Informed to call with any questions or concerns.

## 2020-03-29 NOTE — Telephone Encounter (Signed)
Scheduled per los. Called and spoke with debbie. Confirmed appt

## 2020-03-30 ENCOUNTER — Other Ambulatory Visit: Payer: Self-pay

## 2020-03-30 ENCOUNTER — Inpatient Hospital Stay: Payer: Medicare Other | Attending: Oncology

## 2020-03-30 DIAGNOSIS — C20 Malignant neoplasm of rectum: Secondary | ICD-10-CM | POA: Diagnosis present

## 2020-03-30 DIAGNOSIS — R0609 Other forms of dyspnea: Secondary | ICD-10-CM | POA: Diagnosis not present

## 2020-03-30 DIAGNOSIS — I5022 Chronic systolic (congestive) heart failure: Secondary | ICD-10-CM | POA: Insufficient documentation

## 2020-03-30 DIAGNOSIS — D649 Anemia, unspecified: Secondary | ICD-10-CM | POA: Diagnosis not present

## 2020-03-30 DIAGNOSIS — Z86718 Personal history of other venous thrombosis and embolism: Secondary | ICD-10-CM | POA: Diagnosis not present

## 2020-03-30 DIAGNOSIS — Z933 Colostomy status: Secondary | ICD-10-CM | POA: Diagnosis not present

## 2020-03-30 DIAGNOSIS — Z5111 Encounter for antineoplastic chemotherapy: Secondary | ICD-10-CM | POA: Insufficient documentation

## 2020-03-30 MED ORDER — SODIUM CHLORIDE 0.9% FLUSH
10.0000 mL | INTRAVENOUS | Status: DC | PRN
  Administered 2020-03-30: 10 mL
  Filled 2020-03-30: qty 10

## 2020-03-30 MED ORDER — HEPARIN SOD (PORK) LOCK FLUSH 100 UNIT/ML IV SOLN
500.0000 [IU] | Freq: Once | INTRAVENOUS | Status: AC | PRN
  Administered 2020-03-30: 500 [IU]
  Filled 2020-03-30: qty 5

## 2020-04-05 NOTE — Progress Notes (Signed)
Pharmacist Chemotherapy Monitoring - Follow Up Assessment    I verify that I have reviewed each item in the below checklist:  . Regimen for the patient is scheduled for the appropriate day and plan matches scheduled date. Marland Kitchen Appropriate non-routine labs are ordered dependent on drug ordered. . If applicable, additional medications reviewed and ordered per protocol based on lifetime cumulative doses and/or treatment regimen.   Plan for follow-up and/or issues identified: No . I-vent associated with next due treatment: No . MD and/or nursing notified: No  Tamarick Kovalcik D 04/05/2020 3:44 PM

## 2020-04-06 ENCOUNTER — Other Ambulatory Visit: Payer: Self-pay | Admitting: Oncology

## 2020-04-11 ENCOUNTER — Other Ambulatory Visit: Payer: Self-pay

## 2020-04-11 ENCOUNTER — Inpatient Hospital Stay: Payer: Medicare Other

## 2020-04-11 ENCOUNTER — Inpatient Hospital Stay (HOSPITAL_BASED_OUTPATIENT_CLINIC_OR_DEPARTMENT_OTHER): Payer: Medicare Other | Admitting: Nurse Practitioner

## 2020-04-11 ENCOUNTER — Encounter: Payer: Self-pay | Admitting: Nurse Practitioner

## 2020-04-11 ENCOUNTER — Encounter (INDEPENDENT_AMBULATORY_CARE_PROVIDER_SITE_OTHER): Payer: Self-pay

## 2020-04-11 VITALS — BP 143/86 | HR 84 | Temp 98.7°F | Resp 16 | Ht 72.0 in | Wt 175.1 lb

## 2020-04-11 DIAGNOSIS — C20 Malignant neoplasm of rectum: Secondary | ICD-10-CM

## 2020-04-11 DIAGNOSIS — Z5111 Encounter for antineoplastic chemotherapy: Secondary | ICD-10-CM | POA: Diagnosis not present

## 2020-04-11 LAB — CBC WITH DIFFERENTIAL (CANCER CENTER ONLY)
Abs Immature Granulocytes: 0.01 10*3/uL (ref 0.00–0.07)
Basophils Absolute: 0 10*3/uL (ref 0.0–0.1)
Basophils Relative: 1 %
Eosinophils Absolute: 0.2 10*3/uL (ref 0.0–0.5)
Eosinophils Relative: 4 %
HCT: 26.4 % — ABNORMAL LOW (ref 39.0–52.0)
Hemoglobin: 7.9 g/dL — ABNORMAL LOW (ref 13.0–17.0)
Immature Granulocytes: 0 %
Lymphocytes Relative: 15 %
Lymphs Abs: 0.7 10*3/uL (ref 0.7–4.0)
MCH: 25.3 pg — ABNORMAL LOW (ref 26.0–34.0)
MCHC: 29.9 g/dL — ABNORMAL LOW (ref 30.0–36.0)
MCV: 84.6 fL (ref 80.0–100.0)
Monocytes Absolute: 0.5 10*3/uL (ref 0.1–1.0)
Monocytes Relative: 12 %
Neutro Abs: 3.1 10*3/uL (ref 1.7–7.7)
Neutrophils Relative %: 68 %
Platelet Count: 245 10*3/uL (ref 150–400)
RBC: 3.12 MIL/uL — ABNORMAL LOW (ref 4.22–5.81)
RDW: 18 % — ABNORMAL HIGH (ref 11.5–15.5)
WBC Count: 4.5 10*3/uL (ref 4.0–10.5)
nRBC: 0 % (ref 0.0–0.2)

## 2020-04-11 LAB — CMP (CANCER CENTER ONLY)
ALT: 6 U/L (ref 0–44)
AST: 8 U/L — ABNORMAL LOW (ref 15–41)
Albumin: 2.9 g/dL — ABNORMAL LOW (ref 3.5–5.0)
Alkaline Phosphatase: 85 U/L (ref 38–126)
Anion gap: 10 (ref 5–15)
BUN: 12 mg/dL (ref 8–23)
CO2: 19 mmol/L — ABNORMAL LOW (ref 22–32)
Calcium: 9 mg/dL (ref 8.9–10.3)
Chloride: 111 mmol/L (ref 98–111)
Creatinine: 1.56 mg/dL — ABNORMAL HIGH (ref 0.61–1.24)
GFR, Est AFR Am: 53 mL/min — ABNORMAL LOW (ref >60)
GFR, Estimated: 46 mL/min — ABNORMAL LOW (ref >60)
Glucose, Bld: 109 mg/dL — ABNORMAL HIGH (ref 70–99)
Potassium: 3.7 mmol/L (ref 3.5–5.1)
Sodium: 140 mmol/L (ref 135–145)
Total Bilirubin: 0.3 mg/dL (ref 0.3–1.2)
Total Protein: 7.3 g/dL (ref 6.5–8.1)

## 2020-04-11 MED ORDER — FLUOROURACIL CHEMO INJECTION 2.5 GM/50ML
400.0000 mg/m2 | Freq: Once | INTRAVENOUS | Status: AC
  Administered 2020-04-11: 800 mg via INTRAVENOUS
  Filled 2020-04-11: qty 16

## 2020-04-11 MED ORDER — PALONOSETRON HCL INJECTION 0.25 MG/5ML
0.2500 mg | Freq: Once | INTRAVENOUS | Status: AC
  Administered 2020-04-11: 0.25 mg via INTRAVENOUS

## 2020-04-11 MED ORDER — SODIUM CHLORIDE 0.9 % IV SOLN
10.0000 mg | Freq: Once | INTRAVENOUS | Status: AC
  Administered 2020-04-11: 10 mg via INTRAVENOUS
  Filled 2020-04-11: qty 10

## 2020-04-11 MED ORDER — DEXTROSE 5 % IV SOLN
Freq: Once | INTRAVENOUS | Status: AC
  Filled 2020-04-11: qty 250

## 2020-04-11 MED ORDER — SODIUM CHLORIDE 0.9% FLUSH
10.0000 mL | INTRAVENOUS | Status: DC | PRN
  Filled 2020-04-11: qty 10

## 2020-04-11 MED ORDER — HEPARIN SOD (PORK) LOCK FLUSH 100 UNIT/ML IV SOLN
500.0000 [IU] | Freq: Once | INTRAVENOUS | Status: DC | PRN
  Filled 2020-04-11: qty 5

## 2020-04-11 MED ORDER — LEUCOVORIN CALCIUM INJECTION 350 MG
400.0000 mg/m2 | Freq: Once | INTRAVENOUS | Status: AC
  Administered 2020-04-11: 776 mg via INTRAVENOUS
  Filled 2020-04-11: qty 38.8

## 2020-04-11 MED ORDER — OXALIPLATIN CHEMO INJECTION 100 MG/20ML
85.0000 mg/m2 | Freq: Once | INTRAVENOUS | Status: AC
  Administered 2020-04-11: 165 mg via INTRAVENOUS
  Filled 2020-04-11: qty 33

## 2020-04-11 MED ORDER — SODIUM CHLORIDE 0.9 % IV SOLN
2400.0000 mg/m2 | INTRAVENOUS | Status: DC
  Administered 2020-04-11: 4650 mg via INTRAVENOUS
  Filled 2020-04-11: qty 93

## 2020-04-11 MED ORDER — PALONOSETRON HCL INJECTION 0.25 MG/5ML
INTRAVENOUS | Status: AC
  Filled 2020-04-11: qty 5

## 2020-04-11 NOTE — Patient Instructions (Signed)
[facility] °Discharge Instructions for Patients Receiving Chemotherapy ° °Today you received the following chemotherapy agents Oxaliplatin (ELOXATIN), Leucovorin & Flourouracil (ADRUCIL). ° °To help prevent nausea and vomiting after your treatment, we encourage you to take your nausea medication as prescribed. °  °If you develop nausea and vomiting that is not controlled by your nausea medication, call the clinic.  ° °BELOW ARE SYMPTOMS THAT SHOULD BE REPORTED IMMEDIATELY: °· *FEVER GREATER THAN 100.5 F °· *CHILLS WITH OR WITHOUT FEVER °· NAUSEA AND VOMITING THAT IS NOT CONTROLLED WITH YOUR NAUSEA MEDICATION °· *UNUSUAL SHORTNESS OF BREATH °· *UNUSUAL BRUISING OR BLEEDING °· TENDERNESS IN MOUTH AND THROAT WITH OR WITHOUT PRESENCE OF ULCERS °· *URINARY PROBLEMS °· *BOWEL PROBLEMS °· UNUSUAL RASH °Items with * indicate a potential emergency and should be followed up as soon as possible. ° °Feel free to call the clinic should you have any questions or concerns. The clinic phone number is [phone]. ° °Please show the CHEMO ALERT CARD at check-in to the Emergency Department and triage nurse. ° °Oxaliplatin Injection °What is this medicine? °OXALIPLATIN (ox AL i PLA tin) is a chemotherapy drug. It targets fast dividing cells, like cancer cells, and causes these cells to die. This medicine is used to treat cancers of the colon and rectum, and many other cancers. °This medicine may be used for other purposes; ask your health care provider or pharmacist if you have questions. °COMMON BRAND NAME(S): Eloxatin °What should I tell my health care provider before I take this medicine? °They need to know if you have any of these conditions: °· heart disease °· history of irregular heartbeat °· liver disease °· low blood counts, like white cells, platelets, or red blood cells °· lung or breathing disease, like asthma °· take medicines that treat or prevent blood clots °· tingling of the fingers or toes, or other  nerve disorder °· an unusual or allergic reaction to oxaliplatin, other chemotherapy, other medicines, foods, dyes, or preservatives °· pregnant or trying to get pregnant °· breast-feeding °How should I use this medicine? °This drug is given as an infusion into a vein. It is administered in a hospital or clinic by a specially trained health care professional. °Talk to your pediatrician regarding the use of this medicine in children. Special care may be needed. °Overdosage: If you think you have taken too much of this medicine contact a poison control center or emergency room at once. °NOTE: This medicine is only for you. Do not share this medicine with others. °What if I miss a dose? °It is important not to miss a dose. Call your doctor or health care professional if you are unable to keep an appointment. °What may interact with this medicine? °Do not take this medicine with any of the following medications: °· cisapride °· dronedarone °· pimozide °· thioridazine °This medicine may also interact with the following medications: °· aspirin and aspirin-like medicines °· certain medicines that treat or prevent blood clots like warfarin, apixaban, dabigatran, and rivaroxaban °· cisplatin °· cyclosporine °· diuretics °· medicines for infection like acyclovir, adefovir, amphotericin B, bacitracin, cidofovir, foscarnet, ganciclovir, gentamicin, pentamidine, vancomycin °· NSAIDs, medicines for pain and inflammation, like ibuprofen or naproxen °· other medicines that prolong the QT interval (an abnormal heart rhythm) °· pamidronate °· zoledronic acid °This list may not describe all possible interactions. Give your health care provider a list of all the medicines, herbs, non-prescription drugs, or dietary supplements you use. Also tell them if you   smoke, drink alcohol, or use illegal drugs. Some items may interact with your medicine. °What should I watch for while using this medicine? °Your condition will be monitored  carefully while you are receiving this medicine. °You may need blood work done while you are taking this medicine. °This medicine may make you feel generally unwell. This is not uncommon as chemotherapy can affect healthy cells as well as cancer cells. Report any side effects. Continue your course of treatment even though you feel ill unless your healthcare professional tells you to stop. °This medicine can make you more sensitive to cold. Do not drink cold drinks or use ice. Cover exposed skin before coming in contact with cold temperatures or cold objects. When out in cold weather wear warm clothing and cover your mouth and nose to warm the air that goes into your lungs. Tell your doctor if you get sensitive to the cold. °Do not become pregnant while taking this medicine or for 9 months after stopping it. Women should inform their health care professional if they wish to become pregnant or think they might be pregnant. Men should not father a child while taking this medicine and for 6 months after stopping it. There is potential for serious side effects to an unborn child. Talk to your health care professional for more information. °Do not breast-feed a child while taking this medicine or for 3 months after stopping it. °This medicine has caused ovarian failure in some women. This medicine may make it more difficult to get pregnant. Talk to your health care professional if you are concerned about your fertility. °This medicine has caused decreased sperm counts in some men. This may make it more difficult to father a child. Talk to your health care professional if you are concerned about your fertility. °This medicine may increase your risk of getting an infection. Call your health care professional for advice if you get a fever, chills, or sore throat, or other symptoms of a cold or flu. Do not treat yourself. Try to avoid being around people who are sick. °Avoid taking medicines that contain aspirin,  acetaminophen, ibuprofen, naproxen, or ketoprofen unless instructed by your health care professional. These medicines may hide a fever. °Be careful brushing or flossing your teeth or using a toothpick because you may get an infection or bleed more easily. If you have any dental work done, tell your dentist you are receiving this medicine. °What side effects may I notice from receiving this medicine? °Side effects that you should report to your doctor or health care professional as soon as possible: °· allergic reactions like skin rash, itching or hives, swelling of the face, lips, or tongue °· breathing problems °· cough °· low blood counts - this medicine may decrease the number of white blood cells, red blood cells, and platelets. You may be at increased risk for infections and bleeding °· nausea, vomiting °· pain, redness, or irritation at site where injected °· pain, tingling, numbness in the hands or feet °· signs and symptoms of bleeding such as bloody or black, tarry stools; red or dark brown urine; spitting up blood or brown material that looks like coffee grounds; red spots on the skin; unusual bruising or bleeding from the eyes, gums, or nose °· signs and symptoms of a dangerous change in heartbeat or heart rhythm like chest pain; dizziness; fast, irregular heartbeat; palpitations; feeling faint or lightheaded; falls °· signs and symptoms of infection like fever; chills; cough; sore throat; pain or trouble   passing urine °· signs and symptoms of liver injury like dark yellow or brown urine; general ill feeling or flu-like symptoms; light-colored stools; loss of appetite; nausea; right upper belly pain; unusually weak or tired; yellowing of the eyes or skin °· signs and symptoms of low red blood cells or anemia such as unusually weak or tired; feeling faint or lightheaded; falls °· signs and symptoms of muscle injury like dark urine; trouble passing urine or change in the amount of urine; unusually weak or  tired; muscle pain; back pain °Side effects that usually do not require medical attention (report to your doctor or health care professional if they continue or are bothersome): °· changes in taste °· diarrhea °· gas °· hair loss °· loss of appetite °· mouth sores °This list may not describe all possible side effects. Call your doctor for medical advice about side effects. You may report side effects to FDA at 1-800-FDA-1088. °Where should I keep my medicine? °This drug is given in a hospital or clinic and will not be stored at home. °NOTE: This sheet is a summary. It may not cover all possible information. If you have questions about this medicine, talk to your doctor, pharmacist, or health care provider. °© 2020 [employer] (2019-04-05 12:20:35) ° °Leucovorin injection °What is this medicine? °LEUCOVORIN (loo koe VOR in) is used to prevent or treat the harmful effects of some medicines. This medicine is used to treat anemia caused by a low amount of folic acid in the body. It is also used with 5-fluorouracil (5-FU) to treat colon cancer. °This medicine may be used for other purposes; ask your health care provider or pharmacist if you have questions. °What should I tell my health care provider before I take this medicine? °They need to know if you have any of these conditions: °· anemia from low levels of vitamin B-12 in the blood °· an unusual or allergic reaction to leucovorin, folic acid, other medicines, foods, dyes, or preservatives °· pregnant or trying to get pregnant °· breast-feeding °How should I use this medicine? °This medicine is for injection into a muscle or into a vein. It is given by a health care professional in a hospital or clinic setting. °Talk to your pediatrician regarding the use of this medicine in children. Special care may be needed. °Overdosage: If you think you have taken too much of this medicine contact a poison control center or emergency room at once. °NOTE: This medicine  is only for you. Do not share this medicine with others. °What if I miss a dose? °This does not apply. °What may interact with this medicine? °· capecitabine °· fluorouracil °· phenobarbital °· phenytoin °· primidone °· trimethoprim-sulfamethoxazole °This list may not describe all possible interactions. Give your health care provider a list of all the medicines, herbs, non-prescription drugs, or dietary supplements you use. Also tell them if you smoke, drink alcohol, or use illegal drugs. Some items may interact with your medicine. °What should I watch for while using this medicine? °Your condition will be monitored carefully while you are receiving this medicine. °This medicine may increase the side effects of 5-fluorouracil, 5-FU. Tell your doctor or health care professional if you have diarrhea or mouth sores that do not get better or that get worse. °What side effects may I notice from receiving this medicine? °Side effects that you should report to your doctor or health care professional as soon as possible: °· allergic reactions like skin rash, itching or hives, swelling   of the face, lips, or tongue °· breathing problems °· fever, infection °· mouth sores °· unusual bleeding or bruising °· unusually weak or tired °Side effects that usually do not require medical attention (report to your doctor or health care professional if they continue or are bothersome): °· constipation or diarrhea °· loss of appetite °· nausea, vomiting °This list may not describe all possible side effects. Call your doctor for medical advice about side effects. You may report side effects to FDA at 1-800-FDA-1088. °Where should I keep my medicine? °This drug is given in a hospital or clinic and will not be stored at home. °NOTE: This sheet is a summary. It may not cover all possible information. If you have questions about this medicine, talk to your doctor, pharmacist, or health care provider. °© 2020 [employer] Standard (2008-05-22  16:50:29) ° °Fluorouracil, 5-FU injection °What is this medicine? °FLUOROURACIL, 5-FU (flure oh YOOR a sil) is a chemotherapy drug. It slows the growth of cancer cells. This medicine is used to treat many types of cancer like breast cancer, colon or rectal cancer, pancreatic cancer, and stomach cancer. °This medicine may be used for other purposes; ask your health care provider or pharmacist if you have questions. °COMMON BRAND NAME(S): Adrucil °What should I tell my health care provider before I take this medicine? °They need to know if you have any of these conditions: °· blood disorders °· dihydropyrimidine dehydrogenase (DPD) deficiency °· infection (especially a virus infection such as chickenpox, cold sores, or herpes) °· kidney disease °· liver disease °· malnourished, poor nutrition °· recent or ongoing radiation therapy °· an unusual or allergic reaction to fluorouracil, other chemotherapy, other medicines, foods, dyes, or preservatives °· pregnant or trying to get pregnant °· breast-feeding °How should I use this medicine? °This drug is given as an infusion or injection into a vein. It is administered in a hospital or clinic by a specially trained health care professional. °Talk to your pediatrician regarding the use of this medicine in children. Special care may be needed. °Overdosage: If you think you have taken too much of this medicine contact a poison control center or emergency room at once. °NOTE: This medicine is only for you. Do not share this medicine with others. °What if I miss a dose? °It is important not to miss your dose. Call your doctor or health care professional if you are unable to keep an appointment. °What may interact with this medicine? °· allopurinol °· cimetidine °· dapsone °· digoxin °· hydroxyurea °· leucovorin °· levamisole °· medicines for seizures like ethotoin, fosphenytoin, phenytoin °· medicines to increase blood counts like filgrastim, pegfilgrastim,  sargramostim °· medicines that treat or prevent blood clots like warfarin, enoxaparin, and dalteparin °· methotrexate °· metronidazole °· pyrimethamine °· some other chemotherapy drugs like busulfan, cisplatin, estramustine, vinblastine °· trimethoprim °· trimetrexate °· vaccines °Talk to your doctor or health care professional before taking any of these medicines: °· acetaminophen °· aspirin °· ibuprofen °· ketoprofen °· naproxen °This list may not describe all possible interactions. Give your health care provider a list of all the medicines, herbs, non-prescription drugs, or dietary supplements you use. Also tell them if you smoke, drink alcohol, or use illegal drugs. Some items may interact with your medicine. °What should I watch for while using this medicine? °Visit your doctor for checks on your progress. This drug may make you feel generally unwell. This is not uncommon, as chemotherapy can affect healthy cells as well as cancer cells.   Report any side effects. Continue your course of treatment even though you feel ill unless your doctor tells you to stop. In some cases, you may be given additional medicines to help with side effects. Follow all directions for their use. Call your doctor or health care professional for advice if you get a fever, chills or sore throat, or other symptoms of a cold or flu. Do not treat yourself. This drug decreases your body's ability to fight infections. Try to avoid being around people who are sick. This medicine may increase your risk to bruise or bleed. Call your doctor or health care professional if you notice any unusual bleeding. Be careful brushing and flossing your teeth or using a toothpick because you may get an infection or bleed more easily. If you have any dental work done, tell your dentist you are receiving this medicine. Avoid taking products that contain aspirin, acetaminophen, ibuprofen, naproxen, or ketoprofen unless instructed by your doctor. These  medicines may hide a fever. Do not become pregnant while taking this medicine. Women should inform their doctor if they wish to become pregnant or think they might be pregnant. There is a potential for serious side effects to an unborn child. Talk to your health care professional or pharmacist for more information. Do not breast-feed an infant while taking this medicine. Men should inform their doctor if they wish to father a child. This medicine may lower sperm counts. Do not treat diarrhea with over the counter products. Contact your doctor if you have diarrhea that lasts more than 2 days or if it is severe and watery. This medicine can make you more sensitive to the sun. Keep out of the sun. If you cannot avoid being in the sun, wear protective clothing and use sunscreen. Do not use sun lamps or tanning beds/booths. What side effects may I notice from receiving this medicine? Side effects that you should report to your doctor or health care professional as soon as possible:  allergic reactions like skin rash, itching or hives, swelling of the face, lips, or tongue  low blood counts - this medicine may decrease the number of white blood cells, red blood cells and platelets. You may be at increased risk for infections and bleeding.  signs of infection - fever or chills, cough, sore throat, pain or difficulty passing urine  signs of decreased platelets or bleeding - bruising, pinpoint red spots on the skin, black, tarry stools, blood in the urine  signs of decreased red blood cells - unusually weak or tired, fainting spells, lightheadedness  breathing problems  changes in vision  chest pain  mouth sores  nausea and vomiting  pain, swelling, redness at site where injected  pain, tingling, numbness in the hands or feet  redness, swelling, or sores on hands or feet  stomach pain  unusual bleeding Side effects that usually do not require medical attention (report to your doctor or  health care professional if they continue or are bothersome):  changes in finger or toe nails  diarrhea  dry or itchy skin  hair loss  headache  loss of appetite  sensitivity of eyes to the light  stomach upset  unusually teary eyes This list may not describe all possible side effects. Call your doctor for medical advice about side effects. You may report side effects to FDA at 1-800-FDA-1088. Where should I keep my medicine? This drug is given in a hospital or clinic and will not be stored at home. NOTE: This sheet  is a summary. It may not cover all possible information. If you have questions about this medicine, talk to your doctor, pharmacist, or health care provider. °© 2020 [employer] Standard (2008-03-21 13:53:16) ° °

## 2020-04-11 NOTE — Progress Notes (Addendum)
[facility] OFFICE PROGRESS NOTE   Diagnosis: Rectal cancer  INTERVAL HISTORY:   Clinton Cordova returns as scheduled.  He completed cycle 1 FOLFOX 03/28/2020.  He denies nausea/vomiting.  No mouth sores.  No diarrhea.  He did not experience cold sensitivity.  No numbness or tingling in the hands or feet today.  He has mild dyspnea on exertion and feels fatigued.  Objective:  Vital signs in last 24 hours:  Blood pressure (!) 143/86, pulse 84, temperature 98.7 F (37.1 C), temperature source Temporal, resp. rate 16, height 6' (1.829 m), weight 175 lb 1.6 oz (79.4 kg), SpO2 100 %.    HEENT: No thrush or ulcers. Resp: Lungs clear bilaterally. Cardio: Regular rate and rhythm. GI: Abdomen soft and nontender.  No hepatomegaly.  Drain sites without erythema.  Left lower quadrant colostomy with soft stool in the collection bag. Vascular: No leg edema.  Skin: Palms without erythema. Port-A-Cath without erythema.   Lab Results:  Lab Results  Component Value Date   WBC 4.5 04/11/2020   HGB 7.9 (L) 04/11/2020   HCT 26.4 (L) 04/11/2020   MCV 84.6 04/11/2020   PLT 245 04/11/2020   NEUTROABS 3.1 04/11/2020    Imaging:  No results found.  Medications: I have reviewed the patient's current medications.  Assessment/Plan: 1. Metastatic rectal cancer, perforated rectal tumor with abscess, stage IV ? Presented with "diverticulitis "March 2020, status post drainage catheter ? Colonoscopy 10/17/2019-obstructing mass at 10 cm, biopsy confirmed adenocarcinoma ? MRI pelvis 10/22/2019-T3 versus T4, into, M0 lesion-evaluation limited due to abscess ? Neoadjuvant radiation-short course ? 01/17/2020-palliative low anterior resection, end colostomy, biopsy of peritoneal nodules, liver biopsy, left ureter stent, right ureter reimplantation, placement of pelvic drains  Operative findings included chronic perforation of the rectum with involvement of the pelvic sidewalls and bladder, 2  peritoneal nodules at the sacral promontory, 1 cm mass at segment 4 of the liver  Pathology confirmeda ypT4n, pN1c,M1c,moderately differentiated adenocarcinoma, lymphovascular and perineural invasion present, treatment response score 2, positive distal and radial margins, 0/15 lymph nodes, 1 tumor deposit, no loss of mismatch repair protein expression, MSS, positive for adenocarcinoma involving the right ureter, segment 4 liver, and peritoneal deposits  CTs chest, abdomen, and pelvis 02/23/2020-2 pelvic surgical drains, 2.1 cm hepatic dome hypodensity, 1.8 cm peripheral segment 6/7 lesion, bilateral ureter stents, mild left hydroureteronephrosis, 2 new right lower lobe 0.3 cm nodules-indeterminate  Cycle 1 FOLFOX 03/28/2020  Cycle 2 FOLFOX 04/11/2020 2.Diverticulitis/perforated rectal tumor-pelvic drains remain in place 3.Coronary artery disease 4.Chronic systolic heart failure 5.History of anLV thrombus, status post course of Eliquis, no thrombus visualized on echocardiogram 09/15/2019, LVEF 45-50 6.Renal ultrasound 03/15/2020-moderate bilateral hydronephrosis with ureteral stents in position bilaterally--bilateral stent exchange Dr. Lottie Rater, urology at [facility] 03/22/2020   Disposition: Clinton Cordova appears stable.  He has completed 1 cycle of FOLFOX.  He tolerated the chemotherapy well.  Plan to proceed with cycle 2 today as scheduled.  We reviewed the CBC from today.  Counts adequate to proceed with treatment.  He has symptomatic anemia.  We are making arrangements for a blood transfusion on 04/13/2020.  He agrees with this plan.  We are contacting Dr. Merlyn Lot office to arrange for a follow-up visit to discuss possible drain removal.  He will return for lab, follow-up, cycle 3 FOLFOX in 2 weeks.  He will contact the office in the interim with any problems.  Patient seen with Dr. Benay Spice.    Ned Card ANP/GNP-BC   04/11/2020  9:21  AM  This was a shared  visit with Ned Card.  Clinton Cordova tolerated the first cycle of FOLFOX well.  We recommended he follow-up with Dr. Cecil Cobbs to consider removal of the pelvic drains.  Julieanne Manson, MD

## 2020-04-11 NOTE — Progress Notes (Signed)
Ok to treat with SCr 1.56 today per Ned Card.  Hardie Pulley, PharmD, BCPS, BCOP

## 2020-04-13 ENCOUNTER — Other Ambulatory Visit: Payer: Self-pay

## 2020-04-13 ENCOUNTER — Inpatient Hospital Stay: Payer: Medicare Other

## 2020-04-13 VITALS — BP 134/80 | HR 62 | Temp 98.3°F | Resp 17

## 2020-04-13 DIAGNOSIS — Z5111 Encounter for antineoplastic chemotherapy: Secondary | ICD-10-CM | POA: Diagnosis not present

## 2020-04-13 DIAGNOSIS — C20 Malignant neoplasm of rectum: Secondary | ICD-10-CM

## 2020-04-13 LAB — PREPARE RBC (CROSSMATCH)

## 2020-04-13 MED ORDER — SODIUM CHLORIDE 0.9% FLUSH
3.0000 mL | INTRAVENOUS | Status: DC | PRN
  Filled 2020-04-13: qty 10

## 2020-04-13 MED ORDER — SODIUM CHLORIDE 0.9% FLUSH
10.0000 mL | INTRAVENOUS | Status: DC | PRN
  Administered 2020-04-13: 10 mL
  Filled 2020-04-13: qty 10

## 2020-04-13 MED ORDER — HEPARIN SOD (PORK) LOCK FLUSH 100 UNIT/ML IV SOLN
500.0000 [IU] | Freq: Once | INTRAVENOUS | Status: AC | PRN
  Administered 2020-04-13: 500 [IU]
  Filled 2020-04-13: qty 5

## 2020-04-13 MED ORDER — SODIUM CHLORIDE 0.9% IV SOLUTION
250.0000 mL | Freq: Once | INTRAVENOUS | Status: AC
  Administered 2020-04-13: 250 mL via INTRAVENOUS
  Filled 2020-04-13: qty 250

## 2020-04-13 MED ORDER — HEPARIN SOD (PORK) LOCK FLUSH 100 UNIT/ML IV SOLN
250.0000 [IU] | INTRAVENOUS | Status: DC | PRN
  Filled 2020-04-13: qty 5

## 2020-04-13 NOTE — Patient Instructions (Signed)

## 2020-04-14 LAB — TYPE AND SCREEN
ABO/RH(D): A NEG
Antibody Screen: NEGATIVE
Unit division: 0

## 2020-04-14 LAB — BPAM RBC
Blood Product Expiration Date: 202105302359
ISSUE DATE / TIME: 202105150950
Unit Type and Rh: 600

## 2020-04-15 ENCOUNTER — Telehealth: Payer: Self-pay | Admitting: *Deleted

## 2020-04-15 NOTE — Telephone Encounter (Signed)
Provided patient appointment for 04/18/20 at 0800 to see Dr. Cecil Cobbs to consider removal of pelvic drain. He agrees.

## 2020-04-20 ENCOUNTER — Other Ambulatory Visit: Payer: Self-pay | Admitting: Oncology

## 2020-04-25 ENCOUNTER — Other Ambulatory Visit: Payer: Self-pay

## 2020-04-25 ENCOUNTER — Inpatient Hospital Stay: Payer: Medicare Other

## 2020-04-25 ENCOUNTER — Inpatient Hospital Stay (HOSPITAL_BASED_OUTPATIENT_CLINIC_OR_DEPARTMENT_OTHER): Payer: Medicare Other | Admitting: Oncology

## 2020-04-25 VITALS — BP 151/73 | HR 71 | Temp 98.3°F | Resp 16 | Wt 183.1 lb

## 2020-04-25 DIAGNOSIS — C20 Malignant neoplasm of rectum: Secondary | ICD-10-CM | POA: Diagnosis not present

## 2020-04-25 DIAGNOSIS — Z5111 Encounter for antineoplastic chemotherapy: Secondary | ICD-10-CM | POA: Diagnosis not present

## 2020-04-25 DIAGNOSIS — Z95828 Presence of other vascular implants and grafts: Secondary | ICD-10-CM

## 2020-04-25 LAB — CBC WITH DIFFERENTIAL (CANCER CENTER ONLY)
Abs Immature Granulocytes: 0.01 10*3/uL (ref 0.00–0.07)
Basophils Absolute: 0 10*3/uL (ref 0.0–0.1)
Basophils Relative: 1 %
Eosinophils Absolute: 0.1 10*3/uL (ref 0.0–0.5)
Eosinophils Relative: 4 %
HCT: 26.8 % — ABNORMAL LOW (ref 39.0–52.0)
Hemoglobin: 8 g/dL — ABNORMAL LOW (ref 13.0–17.0)
Immature Granulocytes: 0 %
Lymphocytes Relative: 20 %
Lymphs Abs: 0.6 10*3/uL — ABNORMAL LOW (ref 0.7–4.0)
MCH: 25.2 pg — ABNORMAL LOW (ref 26.0–34.0)
MCHC: 29.9 g/dL — ABNORMAL LOW (ref 30.0–36.0)
MCV: 84.3 fL (ref 80.0–100.0)
Monocytes Absolute: 0.5 10*3/uL (ref 0.1–1.0)
Monocytes Relative: 18 %
Neutro Abs: 1.7 10*3/uL (ref 1.7–7.7)
Neutrophils Relative %: 57 %
Platelet Count: 218 10*3/uL (ref 150–400)
RBC: 3.18 MIL/uL — ABNORMAL LOW (ref 4.22–5.81)
RDW: 18.6 % — ABNORMAL HIGH (ref 11.5–15.5)
WBC Count: 2.9 10*3/uL — ABNORMAL LOW (ref 4.0–10.5)
nRBC: 0 % (ref 0.0–0.2)

## 2020-04-25 LAB — CMP (CANCER CENTER ONLY)
ALT: 6 U/L (ref 0–44)
AST: 10 U/L — ABNORMAL LOW (ref 15–41)
Albumin: 2.9 g/dL — ABNORMAL LOW (ref 3.5–5.0)
Alkaline Phosphatase: 74 U/L (ref 38–126)
Anion gap: 8 (ref 5–15)
BUN: 18 mg/dL (ref 8–23)
CO2: 21 mmol/L — ABNORMAL LOW (ref 22–32)
Calcium: 8.5 mg/dL — ABNORMAL LOW (ref 8.9–10.3)
Chloride: 112 mmol/L — ABNORMAL HIGH (ref 98–111)
Creatinine: 1.45 mg/dL — ABNORMAL HIGH (ref 0.61–1.24)
GFR, Est AFR Am: 58 mL/min — ABNORMAL LOW (ref >60)
GFR, Estimated: 50 mL/min — ABNORMAL LOW (ref >60)
Glucose, Bld: 96 mg/dL (ref 70–99)
Potassium: 3.7 mmol/L (ref 3.5–5.1)
Sodium: 141 mmol/L (ref 135–145)
Total Bilirubin: 0.3 mg/dL (ref 0.3–1.2)
Total Protein: 6.5 g/dL (ref 6.5–8.1)

## 2020-04-25 MED ORDER — PALONOSETRON HCL INJECTION 0.25 MG/5ML
0.2500 mg | Freq: Once | INTRAVENOUS | Status: AC
  Administered 2020-04-25: 0.25 mg via INTRAVENOUS

## 2020-04-25 MED ORDER — DEXTROSE 5 % IV SOLN
Freq: Once | INTRAVENOUS | Status: AC
  Filled 2020-04-25: qty 250

## 2020-04-25 MED ORDER — SODIUM CHLORIDE 0.9 % IV SOLN
2400.0000 mg/m2 | INTRAVENOUS | Status: DC
  Administered 2020-04-25: 4650 mg via INTRAVENOUS
  Filled 2020-04-25: qty 93

## 2020-04-25 MED ORDER — LEUCOVORIN CALCIUM INJECTION 350 MG
400.0000 mg/m2 | Freq: Once | INTRAVENOUS | Status: AC
  Administered 2020-04-25: 776 mg via INTRAVENOUS
  Filled 2020-04-25: qty 38.8

## 2020-04-25 MED ORDER — OXALIPLATIN CHEMO INJECTION 100 MG/20ML
85.0000 mg/m2 | Freq: Once | INTRAVENOUS | Status: AC
  Administered 2020-04-25: 165 mg via INTRAVENOUS
  Filled 2020-04-25: qty 33

## 2020-04-25 MED ORDER — HEPARIN SOD (PORK) LOCK FLUSH 100 UNIT/ML IV SOLN
500.0000 [IU] | Freq: Once | INTRAVENOUS | Status: DC | PRN
  Filled 2020-04-25: qty 5

## 2020-04-25 MED ORDER — SODIUM CHLORIDE 0.9 % IV SOLN
10.0000 mg | Freq: Once | INTRAVENOUS | Status: AC
  Administered 2020-04-25: 10 mg via INTRAVENOUS
  Filled 2020-04-25: qty 10

## 2020-04-25 MED ORDER — SODIUM CHLORIDE 0.9% FLUSH
10.0000 mL | INTRAVENOUS | Status: DC | PRN
  Administered 2020-04-25: 10 mL via INTRAVENOUS
  Filled 2020-04-25: qty 10

## 2020-04-25 MED ORDER — PALONOSETRON HCL INJECTION 0.25 MG/5ML
INTRAVENOUS | Status: AC
  Filled 2020-04-25: qty 5

## 2020-04-25 MED ORDER — SODIUM CHLORIDE 0.9% FLUSH
10.0000 mL | INTRAVENOUS | Status: DC | PRN
  Filled 2020-04-25: qty 10

## 2020-04-25 MED ORDER — FLUOROURACIL CHEMO INJECTION 2.5 GM/50ML
400.0000 mg/m2 | Freq: Once | INTRAVENOUS | Status: AC
  Administered 2020-04-25: 800 mg via INTRAVENOUS
  Filled 2020-04-25: qty 16

## 2020-04-25 NOTE — Patient Instructions (Signed)
[facility] Discharge Instructions for Patients Receiving Chemotherapy  Today you received the following chemotherapy agents:  Leucovorin, Oxaliplatin, & Fluorouracil.   To help prevent nausea and vomiting after your treatment, we encourage you to take your nausea medication as prescribed.  If you develop nausea and vomiting that is not controlled by your nausea medication, call the clinic.   BELOW ARE SYMPTOMS THAT SHOULD BE REPORTED IMMEDIATELY:  *FEVER GREATER THAN 100.5 F  *CHILLS WITH OR WITHOUT FEVER  NAUSEA AND VOMITING THAT IS NOT CONTROLLED WITH YOUR NAUSEA MEDICATION  *UNUSUAL SHORTNESS OF BREATH  *UNUSUAL BRUISING OR BLEEDING  TENDERNESS IN MOUTH AND THROAT WITH OR WITHOUT PRESENCE OF ULCERS  *URINARY PROBLEMS  *BOWEL PROBLEMS  UNUSUAL RASH Items with * indicate a potential emergency and should be followed up as soon as possible.  Feel free to call the clinic should you have any questions or concerns. The clinic phone number is [phone].  Please show the CHEMO ALERT CARD at check-in to the Emergency Department and triage nurse.   

## 2020-04-25 NOTE — Progress Notes (Signed)
Clinton Cordova   Diagnosis: Rectal cancer  INTERVAL HISTORY:   Clinton Cordova completed another treatment with FOLFOX on 04/11/2020.  He had cold sensitivity when reaching in the refrigerator on day 1.  No other neurologic symptoms.  No nausea/vomiting, mouth sores, or diarrhea.  There is low volume fluid in the pelvic drains.  Objective:  Vital signs in last 24 hours:  Blood pressure (!) 151/73, pulse 71, temperature 98.3 F (36.8 C), temperature source Oral, resp. rate 16, weight 183 lb 1.6 oz (83.1 kg), SpO2 100 %.    HEENT: No thrush or ulcers Resp: Lungs clear bilaterally Cardio: Regular rate and rhythm GI: No hepatomegaly, left lower quadrant colostomy with brown formed stool, right lower quadrant drains Vascular: No leg edema    Portacath/PICC-without erythema  Lab Results:  Lab Results  Component Value Date   WBC 2.9 (L) 04/25/2020   HGB 8.0 (L) 04/25/2020   HCT 26.8 (L) 04/25/2020   MCV 84.3 04/25/2020   PLT 218 04/25/2020   NEUTROABS 1.7 04/25/2020    CMP  Lab Results  Component Value Date   NA 140 04/11/2020   K 3.7 04/11/2020   CL 111 04/11/2020   CO2 19 (L) 04/11/2020   GLUCOSE 109 (H) 04/11/2020   BUN 12 04/11/2020   CREATININE 1.56 (H) 04/11/2020   CALCIUM 9.0 04/11/2020   PROT 7.3 04/11/2020   ALBUMIN 2.9 (L) 04/11/2020   AST 8 (L) 04/11/2020   ALT 6 04/11/2020   ALKPHOS 85 04/11/2020   BILITOT 0.3 04/11/2020   GFRNONAA 46 (L) 04/11/2020   GFRAA 53 (L) 04/11/2020    Lab Results  Component Value Date   CEA1 3.28 03/14/2020     Medications: I have reviewed the patient's current medications.   Assessment/Plan: 1. Metastatic rectal cancer, perforated rectal tumor with abscess, stage IV ? Presented with "diverticulitis "March 2020, status post drainage catheter ? Colonoscopy 10/17/2019-obstructing mass at 10 cm, biopsy confirmed adenocarcinoma ? MRI pelvis 10/22/2019-T3 versus T4, into, M0  lesion-evaluation limited due to abscess ? Neoadjuvant radiation-short course ? 01/17/2020-palliative low anterior resection, end colostomy, biopsy of peritoneal nodules, liver biopsy, left ureter stent, right ureter reimplantation, placement of pelvic drains  Operative findings included chronic perforation of the rectum with involvement of the pelvic sidewalls and bladder, 2 peritoneal nodules at the sacral promontory, 1 cm mass at segment 4 of the liver  Pathology confirmeda ypT4n, pN1c,M1c,moderately differentiated adenocarcinoma, lymphovascular and perineural invasion present, treatment response score 2, positive distal and radial margins, 0/15 lymph nodes, 1 tumor deposit, no loss of mismatch repair protein expression, MSS, positive for adenocarcinoma involving the right ureter, segment 4 liver, and peritoneal deposits  CTs chest, abdomen, and pelvis 02/23/2020-2 pelvic surgical drains, 2.1 cm hepatic dome hypodensity, 1.8 cm peripheral segment 6/7 lesion, bilateral ureter stents, mild left hydroureteronephrosis, 2 new right lower lobe 0.3 cm nodules-indeterminate  Cycle 1 FOLFOX 03/28/2020  Cycle 2 FOLFOX 04/11/2020  Cycle 3 FOLFOX 04/25/2020 2.Diverticulitis/perforated rectal tumor-pelvic drains remain in place 3.Coronary artery disease 4.Chronic systolic heart failure 5.History of anLV thrombus, status post course of Eliquis, no thrombus visualized on echocardiogram 09/15/2019, LVEF 45-50 6.Renal ultrasound 03/15/2020-moderate bilateral hydronephrosis with ureteral stents in position bilaterally--bilateral stent exchange Dr. Lottie Rater, urology at [facility] 03/22/2020 7.   Anemia 8.   Renal failure-improved following ureter stent exchange April 2021     Disposition: Clinton Cordova appears stable.  He is tolerating the FOLFOX well.  He will complete cycle 3 today.  I encouraged him to schedule an appointment with Clinton Cordova to consider removal of the pelvic drains.  He  will return for an office visit and chemotherapy in 2 weeks.  I encouraged Mr. Smoker to obtain the COVID-19 vaccine.  Betsy Coder, MD  04/25/2020  10:38 AM

## 2020-04-27 ENCOUNTER — Inpatient Hospital Stay: Payer: Medicare Other

## 2020-04-27 ENCOUNTER — Other Ambulatory Visit: Payer: Self-pay

## 2020-04-27 VITALS — BP 141/78 | HR 79 | Temp 97.9°F | Resp 18

## 2020-04-27 DIAGNOSIS — C20 Malignant neoplasm of rectum: Secondary | ICD-10-CM

## 2020-04-27 MED ORDER — HEPARIN SOD (PORK) LOCK FLUSH 100 UNIT/ML IV SOLN
500.0000 [IU] | Freq: Once | INTRAVENOUS | Status: DC | PRN
  Filled 2020-04-27: qty 5

## 2020-04-27 MED ORDER — SODIUM CHLORIDE 0.9% FLUSH
10.0000 mL | INTRAVENOUS | Status: DC | PRN
  Filled 2020-04-27: qty 10

## 2020-05-02 NOTE — Progress Notes (Signed)
Pharmacist Chemotherapy Monitoring - Follow Up Assessment    I verify that I have reviewed each item in the below checklist:  . Regimen for the patient is scheduled for the appropriate day and plan matches scheduled date. Marland Kitchen Appropriate non-routine labs are ordered dependent on drug ordered. . If applicable, additional medications reviewed and ordered per protocol based on lifetime cumulative doses and/or treatment regimen.   Plan for follow-up and/or issues identified: No . I-vent associated with next due treatment: No . MD and/or nursing notified: No  Clinton Cordova D 05/02/2020 1:29 PM

## 2020-05-05 ENCOUNTER — Other Ambulatory Visit: Payer: Self-pay | Admitting: Oncology

## 2020-05-08 ENCOUNTER — Inpatient Hospital Stay: Payer: Medicare Other

## 2020-05-08 ENCOUNTER — Other Ambulatory Visit: Payer: Self-pay

## 2020-05-08 ENCOUNTER — Inpatient Hospital Stay: Payer: Medicare Other | Attending: Oncology | Admitting: Oncology

## 2020-05-08 ENCOUNTER — Ambulatory Visit (HOSPITAL_COMMUNITY)
Admission: RE | Admit: 2020-05-08 | Discharge: 2020-05-08 | Disposition: A | Discharge status: Home / Self Care | Payer: Medicare Other | Source: Ambulatory Visit | Attending: Oncology | Admitting: Oncology

## 2020-05-08 ENCOUNTER — Other Ambulatory Visit: Payer: Self-pay | Admitting: *Deleted

## 2020-05-08 VITALS — BP 151/72 | HR 80 | Temp 98.1°F | Resp 18 | Ht 72.0 in | Wt 181.7 lb

## 2020-05-08 DIAGNOSIS — N179 Acute kidney failure, unspecified: Secondary | ICD-10-CM | POA: Insufficient documentation

## 2020-05-08 DIAGNOSIS — Z9221 Personal history of antineoplastic chemotherapy: Secondary | ICD-10-CM | POA: Diagnosis not present

## 2020-05-08 DIAGNOSIS — C20 Malignant neoplasm of rectum: Secondary | ICD-10-CM | POA: Insufficient documentation

## 2020-05-08 DIAGNOSIS — D649 Anemia, unspecified: Secondary | ICD-10-CM | POA: Insufficient documentation

## 2020-05-08 DIAGNOSIS — Z933 Colostomy status: Secondary | ICD-10-CM | POA: Diagnosis not present

## 2020-05-08 DIAGNOSIS — Z95828 Presence of other vascular implants and grafts: Secondary | ICD-10-CM

## 2020-05-08 DIAGNOSIS — I5022 Chronic systolic (congestive) heart failure: Secondary | ICD-10-CM | POA: Diagnosis not present

## 2020-05-08 DIAGNOSIS — I2510 Atherosclerotic heart disease of native coronary artery without angina pectoris: Secondary | ICD-10-CM | POA: Insufficient documentation

## 2020-05-08 LAB — CBC WITH DIFFERENTIAL (CANCER CENTER ONLY)
Abs Immature Granulocytes: 0.01 10*3/uL (ref 0.00–0.07)
Basophils Absolute: 0 10*3/uL (ref 0.0–0.1)
Basophils Relative: 1 %
Eosinophils Absolute: 0.1 10*3/uL (ref 0.0–0.5)
Eosinophils Relative: 3 %
HCT: 30.3 % — ABNORMAL LOW (ref 39.0–52.0)
Hemoglobin: 9.1 g/dL — ABNORMAL LOW (ref 13.0–17.0)
Immature Granulocytes: 0 %
Lymphocytes Relative: 10 %
Lymphs Abs: 0.3 10*3/uL — ABNORMAL LOW (ref 0.7–4.0)
MCH: 25.3 pg — ABNORMAL LOW (ref 26.0–34.0)
MCHC: 30 g/dL (ref 30.0–36.0)
MCV: 84.4 fL (ref 80.0–100.0)
Monocytes Absolute: 0.5 10*3/uL (ref 0.1–1.0)
Monocytes Relative: 15 %
Neutro Abs: 2.3 10*3/uL (ref 1.7–7.7)
Neutrophils Relative %: 71 %
Platelet Count: 168 10*3/uL (ref 150–400)
RBC: 3.59 MIL/uL — ABNORMAL LOW (ref 4.22–5.81)
RDW: 20.9 % — ABNORMAL HIGH (ref 11.5–15.5)
WBC Count: 3.3 10*3/uL — ABNORMAL LOW (ref 4.0–10.5)
nRBC: 0 % (ref 0.0–0.2)

## 2020-05-08 LAB — CMP (CANCER CENTER ONLY)
ALT: 8 U/L (ref 0–44)
AST: 9 U/L — ABNORMAL LOW (ref 15–41)
Albumin: 3 g/dL — ABNORMAL LOW (ref 3.5–5.0)
Alkaline Phosphatase: 72 U/L (ref 38–126)
Anion gap: 13 (ref 5–15)
BUN: 34 mg/dL — ABNORMAL HIGH (ref 8–23)
CO2: 16 mmol/L — ABNORMAL LOW (ref 22–32)
Calcium: 8.9 mg/dL (ref 8.9–10.3)
Chloride: 112 mmol/L — ABNORMAL HIGH (ref 98–111)
Creatinine: 2.63 mg/dL — ABNORMAL HIGH (ref 0.61–1.24)
GFR, Est AFR Am: 28 mL/min — ABNORMAL LOW (ref >60)
GFR, Estimated: 24 mL/min — ABNORMAL LOW (ref >60)
Glucose, Bld: 130 mg/dL — ABNORMAL HIGH (ref 70–99)
Potassium: 4.3 mmol/L (ref 3.5–5.1)
Sodium: 141 mmol/L (ref 135–145)
Total Bilirubin: 0.3 mg/dL (ref 0.3–1.2)
Total Protein: 7.1 g/dL (ref 6.5–8.1)

## 2020-05-08 LAB — SAMPLE TO BLOOD BANK

## 2020-05-08 MED ORDER — SODIUM CHLORIDE 0.9% FLUSH
10.0000 mL | INTRAVENOUS | Status: DC | PRN
  Administered 2020-05-08: 10 mL via INTRAVENOUS
  Filled 2020-05-08: qty 10

## 2020-05-08 MED ORDER — HEPARIN SOD (PORK) LOCK FLUSH 100 UNIT/ML IV SOLN
500.0000 [IU] | Freq: Once | INTRAVENOUS | Status: AC
  Administered 2020-05-08: 500 [IU] via INTRAVENOUS
  Filled 2020-05-08: qty 5

## 2020-05-08 NOTE — Progress Notes (Signed)
[place] OFFICE PROGRESS NOTE   Diagnosis: Rectal cancer rectal cancer  INTERVAL HISTORY:   Clinton Cordova completed another cycle of FOLFOX on 04/25/2020.  No nausea/vomiting, mouth sores, diarrhea, or neuropathy symptoms.  He developed a "urinary tract infection "characterized by burning and hematuria.  He was seen in [place].  Urinalysis on 05/01/2020 revealed red cells, white cells, and moderate bacteria.  He was prescribed cephalexin.  A urine culture returned negative.  He reports improvement in his symptoms.  He continues to have intermittent hematuria.  He has minimal drainage from the pelvic drains.  He is scheduled for follow-up at [facility] next week.  Objective:  Vital signs in last 24 hours:  Blood pressure (!) 151/72, pulse 80, temperature 98.1 F (36.7 C), temperature source Temporal, resp. rate 18, height 6' (1.829 m), weight 181 lb 11.2 oz (82.4 kg), SpO2 100 %.    HEENT: No thrush or ulcers Resp: Lungs clear bilaterally Cardio: Regular rate and rhythm GI: No hepatomegaly, left lower quadrant colostomy with formed stool, small amount of serous drainage in the pelvic drains Vascular: No leg edema    Portacath/PICC-without erythema  Lab Results:  Lab Results  Component Value Date   WBC 3.3 (L) 05/08/2020   HGB 9.1 (L) 05/08/2020   HCT 30.3 (L) 05/08/2020   MCV 84.4 05/08/2020   PLT 168 05/08/2020   NEUTROABS 2.3 05/08/2020    CMP  Lab Results  Component Value Date   NA 141 05/08/2020   K 4.3 05/08/2020   CL 112 (H) 05/08/2020   CO2 16 (L) 05/08/2020   GLUCOSE 130 (H) 05/08/2020   BUN 34 (H) 05/08/2020   CREATININE 2.63 (H) 05/08/2020   CALCIUM 8.9 05/08/2020   PROT 7.1 05/08/2020   ALBUMIN 3.0 (L) 05/08/2020   AST 9 (L) 05/08/2020   ALT 8 05/08/2020   ALKPHOS 72 05/08/2020   BILITOT 0.3 05/08/2020   GFRNONAA 24 (L) 05/08/2020   GFRAA 28 (L) 05/08/2020    Lab Results  Component Value Date   CEA1 3.28 03/14/2020     Medications:  I have reviewed the patient's current medications.   Assessment/Plan:  1. Metastatic rectal cancer, perforated rectal tumor with abscess, stage IV ? Presented with "diverticulitis "March 2020, status post drainage catheter ? Colonoscopy 10/17/2019-obstructing mass at 10 cm, biopsy confirmed adenocarcinoma ? MRI pelvis 10/22/2019-T3 versus T4, into, M0 lesion-evaluation limited due to abscess ? Neoadjuvant radiation-short course ? 01/17/2020-palliative low anterior resection, end colostomy, biopsy of peritoneal nodules, liver biopsy, left ureter stent, right ureter reimplantation, placement of pelvic drains  Operative findings included chronic perforation of the rectum with involvement of the pelvic sidewalls and bladder, 2 peritoneal nodules at the sacral promontory, 1 cm mass at segment 4 of the liver  Pathology confirmeda ypT4n, pN1c,M1c,moderately differentiated adenocarcinoma, lymphovascular and perineural invasion present, treatment response score 2, positive distal and radial margins, 0/15 lymph nodes, 1 tumor deposit, no loss of mismatch repair protein expression, MSS, positive for adenocarcinoma involving the right ureter, segment 4 liver, and peritoneal deposits  CTs chest, abdomen, and pelvis 02/23/2020-2 pelvic surgical drains, 2.1 cm hepatic dome hypodensity, 1.8 cm peripheral segment 6/7 lesion, bilateral ureter stents, mild left hydroureteronephrosis, 2 new right lower lobe 0.3 cm nodules-indeterminate  Cycle 1 FOLFOX 03/28/2020  Cycle 2 FOLFOX 04/11/2020  Cycle 3 FOLFOX 04/25/2020 2.Diverticulitis/perforated rectal tumor-pelvic drains remain in place 3.Coronary artery disease 4.Chronic systolic heart failure 5.History of anLV thrombus, status post course of Eliquis, no thrombus visualized  on echocardiogram 09/15/2019, LVEF 45-50 6.Renal ultrasound 03/15/2020-moderate bilateral hydronephrosis with ureteral stents in position bilaterally--bilateral stent  exchange Dr. Lottie Rater, urology at [facility] 03/22/2020 7.   Anemia 8.   Renal failure-improved following ureter stent exchange April 2021  Progressive renal failure 05/08/2020-ultrasound with mild to moderate left hydronephrosis, resolution of right hydronephrosis, and left double-J stent was not appreciated      Disposition: Clinton Cordova has metastatic rectal cancer.  He has completed 3 cycles of FOLFOX.  He has tolerated the chemotherapy well.  He has progressive renal failure today.  The creatinine had improved following replacement of ureter stents in April.  Renal ultrasound reveals persistent left hydronephrosis and the left stent was not visualized.  It is possible the hydronephrosis and his urinary symptoms are related to migration of the stent.  I will contact Dr. Lottie Rater.  Chemotherapy will be held today.  I recommended he increase his fluid intake, discontinue naproxen, and return for a chemistry panel in 2 days.  Clinton Cordova is scheduled for follow-up at [facility] next week.  He will be scheduled for another cycle of FOLFOX on 05/15/2020.  Betsy Coder, MD  05/08/2020  10:30 AM

## 2020-05-08 NOTE — Progress Notes (Signed)
Faxed renal US report to Dr. Frances Furbish at [facility] w/request to call Dr. Benay Spice on 6/10 to discuss case. Contact numbers for Dr. Benay Spice provided. High priority scheduling message sent for repeat lab on 05/10/20.

## 2020-05-10 ENCOUNTER — Inpatient Hospital Stay: Payer: Medicare Other

## 2020-05-10 ENCOUNTER — Ambulatory Visit (HOSPITAL_COMMUNITY)
Admission: RE | Admit: 2020-05-10 | Discharge: 2020-05-10 | Disposition: A | Discharge status: Home / Self Care | Payer: Medicare Other | Source: Ambulatory Visit | Attending: Oncology | Admitting: Oncology

## 2020-05-10 ENCOUNTER — Telehealth: Payer: Self-pay | Admitting: *Deleted

## 2020-05-10 ENCOUNTER — Other Ambulatory Visit: Payer: Self-pay

## 2020-05-10 ENCOUNTER — Other Ambulatory Visit: Payer: Self-pay | Admitting: *Deleted

## 2020-05-10 ENCOUNTER — Encounter: Payer: Self-pay | Admitting: *Deleted

## 2020-05-10 DIAGNOSIS — C20 Malignant neoplasm of rectum: Secondary | ICD-10-CM | POA: Diagnosis not present

## 2020-05-10 DIAGNOSIS — N179 Acute kidney failure, unspecified: Secondary | ICD-10-CM | POA: Diagnosis not present

## 2020-05-10 LAB — BASIC METABOLIC PANEL - CANCER CENTER ONLY
Anion gap: 12 (ref 5–15)
BUN: 37 mg/dL — ABNORMAL HIGH (ref 8–23)
CO2: 15 mmol/L — ABNORMAL LOW (ref 22–32)
Calcium: 9.5 mg/dL (ref 8.9–10.3)
Chloride: 112 mmol/L — ABNORMAL HIGH (ref 98–111)
Creatinine: 3.31 mg/dL (ref 0.61–1.24)
GFR, Est AFR Am: 21 mL/min — ABNORMAL LOW (ref >60)
GFR, Estimated: 18 mL/min — ABNORMAL LOW (ref >60)
Glucose, Bld: 131 mg/dL — ABNORMAL HIGH (ref 70–99)
Potassium: 4.8 mmol/L (ref 3.5–5.1)
Sodium: 139 mmol/L (ref 135–145)

## 2020-05-10 NOTE — Progress Notes (Signed)
CRITICAL VALUE STICKER  CRITICAL VALUE: Creatinine 3.31   RECEIVER (on-site recipient of call): Manuela Schwartz, [place] NOTIFIED: 05/10/20 @ 0919  MESSENGER (representative from lab): Suanne Marker  MD NOTIFIED: Dr. Benay Spice  TIME OF NOTIFICATION: 3383  RESPONSE: Needs to see urologist, Dr. Demetrios Isaacs. Called Dr. Dustin Folks office [phone]) and left VM with Dr. Gearldine Shown phone and pager number to call to discuss. Renal US and request for call/appointment w/labs from 05/08/20 were faxed on 05/08/20 to [phone].

## 2020-05-10 NOTE — Progress Notes (Signed)
Dr. Benay Spice was able to speak w/Dr. Cletus Gash. Requesting stat KUB to assess stent placement. If stents are not  In proper position will get him to [facility] today for exchange. Order placed and patient notified. Will go to [facility] now.

## 2020-05-10 NOTE — Telephone Encounter (Signed)
Informed him of KUB results and Dr. Benay Spice spoke w/Dr. Lottie Rater at [facility]. They will be calling him soon to get him in for stent exchange. Patient reports he is in a lot of pain. Per Dr. Benay Spice: You need to go to the [facility] emergency room.

## 2020-05-22 ENCOUNTER — Other Ambulatory Visit: Payer: Self-pay | Admitting: *Deleted

## 2020-05-22 DIAGNOSIS — C20 Malignant neoplasm of rectum: Secondary | ICD-10-CM

## 2020-05-22 MED FILL — Dexamethasone Sodium Phosphate Inj 100 MG/10ML: INTRAMUSCULAR | Qty: 1 | Status: AC

## 2020-05-23 ENCOUNTER — Inpatient Hospital Stay: Payer: Medicare Other | Admitting: Nurse Practitioner

## 2020-05-23 ENCOUNTER — Inpatient Hospital Stay: Payer: Medicare Other

## 2020-05-25 ENCOUNTER — Inpatient Hospital Stay: Payer: Medicare Other

## 2020-06-03 ENCOUNTER — Other Ambulatory Visit: Payer: Self-pay | Admitting: Oncology

## 2020-06-07 ENCOUNTER — Encounter: Payer: Self-pay | Admitting: Nurse Practitioner

## 2020-06-07 ENCOUNTER — Inpatient Hospital Stay: Payer: Medicare Other | Attending: Oncology

## 2020-06-07 ENCOUNTER — Inpatient Hospital Stay: Payer: Medicare Other

## 2020-06-07 ENCOUNTER — Inpatient Hospital Stay (HOSPITAL_BASED_OUTPATIENT_CLINIC_OR_DEPARTMENT_OTHER): Payer: Medicare Other | Admitting: Nurse Practitioner

## 2020-06-07 ENCOUNTER — Other Ambulatory Visit: Payer: Self-pay

## 2020-06-07 VITALS — BP 138/78 | HR 67 | Temp 97.9°F | Resp 18 | Ht 72.0 in | Wt 167.2 lb

## 2020-06-07 VITALS — BP 123/70 | HR 52

## 2020-06-07 DIAGNOSIS — Z5111 Encounter for antineoplastic chemotherapy: Secondary | ICD-10-CM | POA: Insufficient documentation

## 2020-06-07 DIAGNOSIS — Z95828 Presence of other vascular implants and grafts: Secondary | ICD-10-CM | POA: Insufficient documentation

## 2020-06-07 DIAGNOSIS — D649 Anemia, unspecified: Secondary | ICD-10-CM | POA: Diagnosis not present

## 2020-06-07 DIAGNOSIS — I2510 Atherosclerotic heart disease of native coronary artery without angina pectoris: Secondary | ICD-10-CM | POA: Diagnosis not present

## 2020-06-07 DIAGNOSIS — I5022 Chronic systolic (congestive) heart failure: Secondary | ICD-10-CM | POA: Diagnosis not present

## 2020-06-07 DIAGNOSIS — Z79899 Other long term (current) drug therapy: Secondary | ICD-10-CM | POA: Insufficient documentation

## 2020-06-07 DIAGNOSIS — C20 Malignant neoplasm of rectum: Secondary | ICD-10-CM

## 2020-06-07 DIAGNOSIS — Z923 Personal history of irradiation: Secondary | ICD-10-CM | POA: Insufficient documentation

## 2020-06-07 DIAGNOSIS — Z933 Colostomy status: Secondary | ICD-10-CM | POA: Insufficient documentation

## 2020-06-07 LAB — CBC WITH DIFFERENTIAL (CANCER CENTER ONLY)
Abs Immature Granulocytes: 0.04 10*3/uL (ref 0.00–0.07)
Basophils Absolute: 0.1 10*3/uL (ref 0.0–0.1)
Basophils Relative: 1 %
Eosinophils Absolute: 0.3 10*3/uL (ref 0.0–0.5)
Eosinophils Relative: 3 %
HCT: 31.3 % — ABNORMAL LOW (ref 39.0–52.0)
Hemoglobin: 9.7 g/dL — ABNORMAL LOW (ref 13.0–17.0)
Immature Granulocytes: 1 %
Lymphocytes Relative: 18 %
Lymphs Abs: 1.4 10*3/uL (ref 0.7–4.0)
MCH: 26.8 pg (ref 26.0–34.0)
MCHC: 31 g/dL (ref 30.0–36.0)
MCV: 86.5 fL (ref 80.0–100.0)
Monocytes Absolute: 0.8 10*3/uL (ref 0.1–1.0)
Monocytes Relative: 10 %
Neutro Abs: 5.3 10*3/uL (ref 1.7–7.7)
Neutrophils Relative %: 67 %
Platelet Count: 406 10*3/uL — ABNORMAL HIGH (ref 150–400)
RBC: 3.62 MIL/uL — ABNORMAL LOW (ref 4.22–5.81)
RDW: 19 % — ABNORMAL HIGH (ref 11.5–15.5)
WBC Count: 7.8 10*3/uL (ref 4.0–10.5)
nRBC: 0 % (ref 0.0–0.2)

## 2020-06-07 LAB — CMP (CANCER CENTER ONLY)
ALT: 9 U/L (ref 0–44)
AST: 12 U/L — ABNORMAL LOW (ref 15–41)
Albumin: 3.2 g/dL — ABNORMAL LOW (ref 3.5–5.0)
Alkaline Phosphatase: 109 U/L (ref 38–126)
Anion gap: 12 (ref 5–15)
BUN: 26 mg/dL — ABNORMAL HIGH (ref 8–23)
CO2: 17 mmol/L — ABNORMAL LOW (ref 22–32)
Calcium: 9.8 mg/dL (ref 8.9–10.3)
Chloride: 106 mmol/L (ref 98–111)
Creatinine: 2.15 mg/dL — ABNORMAL HIGH (ref 0.61–1.24)
GFR, Est AFR Am: 36 mL/min — ABNORMAL LOW (ref >60)
GFR, Estimated: 31 mL/min — ABNORMAL LOW (ref >60)
Glucose, Bld: 102 mg/dL — ABNORMAL HIGH (ref 70–99)
Potassium: 4.3 mmol/L (ref 3.5–5.1)
Sodium: 135 mmol/L (ref 135–145)
Total Bilirubin: 0.2 mg/dL — ABNORMAL LOW (ref 0.3–1.2)
Total Protein: 8 g/dL (ref 6.5–8.1)

## 2020-06-07 MED ORDER — HEPARIN SOD (PORK) LOCK FLUSH 100 UNIT/ML IV SOLN
500.0000 [IU] | Freq: Once | INTRAVENOUS | Status: AC | PRN
  Administered 2020-06-07: 500 [IU]
  Filled 2020-06-07: qty 5

## 2020-06-07 MED ORDER — SODIUM CHLORIDE 0.9% FLUSH
10.0000 mL | INTRAVENOUS | Status: DC | PRN
  Administered 2020-06-07: 10 mL
  Filled 2020-06-07: qty 10

## 2020-06-07 MED ORDER — SODIUM CHLORIDE 0.9 % IV SOLN
INTRAVENOUS | Status: DC
  Filled 2020-06-07 (×2): qty 250

## 2020-06-07 MED FILL — Dexamethasone Sodium Phosphate Inj 100 MG/10ML: INTRAMUSCULAR | Qty: 1 | Status: AC

## 2020-06-07 NOTE — Patient Instructions (Signed)
Dehydration, Adult Dehydration is a condition in which there is not enough water or other fluids in the body. This happens when a person loses more fluids than he or she takes in. Important organs, such as the kidneys, brain, and heart, cannot function without a proper amount of fluids. Any loss of fluids from the body can lead to dehydration. Dehydration can be mild, moderate, or severe. It should be treated right away to prevent it from becoming severe. What are the causes? Dehydration may be caused by:  Conditions that cause loss of water or other fluids, such as diarrhea, vomiting, or sweating or urinating a lot.  Not drinking enough fluids, especially when you are ill or doing activities that require a lot of energy.  Other illnesses and conditions, such as fever or infection.  Certain medicines, such as medicines that remove excess fluid from the body (diuretics).  Lack of safe drinking water.  Not being able to get enough water and food. What increases the risk? The following factors may make you more likely to develop this condition:  Having a long-term (chronic) illness that has not been treated properly, such as diabetes, heart disease, or kidney disease.  Being 65 years of age or older.  Having a disability.  Living in a place that is high in altitude, where thinner, drier air causes more fluid loss.  Doing exercises that put stress on your body for a long time (endurance sports). What are the signs or symptoms? Symptoms of dehydration depend on how severe it is. Mild or moderate dehydration  Thirst.  Dry lips or dry mouth.  Dizziness or light-headedness, especially when standing up from a seated position.  Muscle cramps.  Dark urine. Urine may be the color of tea.  Less urine or tears produced than usual.  Headache. Severe dehydration  Changes in skin. Your skin may be cold and clammy, blotchy, or pale. Your skin also may not return to normal after being  lightly pinched and released.  Little or no tears, urine, or sweat.  Changes in vital signs, such as rapid breathing and low blood pressure. Your pulse may be weak or may be faster than 100 beats a minute when you are sitting still.  Other changes, such as: ? Feeling very thirsty. ? Sunken eyes. ? Cold hands and feet. ? Confusion. ? Being very tired (lethargic) or having trouble waking from sleep. ? Short-term weight loss. ? Loss of consciousness. How is this diagnosed? This condition is diagnosed based on your symptoms and a physical exam. You may have blood and urine tests to help confirm the diagnosis. How is this treated? Treatment for this condition depends on how severe it is. Treatment should be started right away. Do not wait until dehydration becomes severe. Severe dehydration is an emergency and needs to be treated in a hospital.  Mild or moderate dehydration can be treated at home. You may be asked to: ? Drink more fluids. ? Drink an oral rehydration solution (ORS). This drink helps restore proper amounts of fluids and salts and minerals in the blood (electrolytes).  Severe dehydration can be treated: ? With IV fluids. ? By correcting abnormal levels of electrolytes. This is often done by giving electrolytes through a tube that is passed through your nose and into your stomach (nasogastric tube, or NG tube). ? By treating the underlying cause of dehydration. Follow these instructions at home: Oral rehydration solution If told by your health care provider, drink an ORS:  Make   an ORS by following instructions on the package.  Start by drinking small amounts, about  cup (120 mL) every 5-10 minutes.  Slowly increase how much you drink until you have taken the amount recommended by your health care provider. Eating and drinking         Drink enough clear fluid to keep your urine pale yellow. If you were told to drink an ORS, finish the ORS first and then start slowly  drinking other clear fluids. Drink fluids such as: ? Water. Do not drink only water. Doing that can lead to hyponatremia, which is having too little salt (sodium) in the body. ? Water from ice chips you suck on. ? Fruit juice that you have added water to (diluted fruit juice). ? Low-calorie sports drinks.  Eat foods that contain a healthy balance of electrolytes, such as bananas, oranges, potatoes, tomatoes, and spinach.  Do not drink alcohol.  Avoid the following: ? Drinks that contain a lot of sugar. These include high-calorie sports drinks, fruit juice that is not diluted, and soda. ? Caffeine. ? Foods that are greasy or contain a lot of fat or sugar. General instructions  Take over-the-counter and prescription medicines only as told by your health care provider.  Do not take sodium tablets. Doing that can lead to having too much sodium in the body (hypernatremia).  Return to your normal activities as told by your health care provider. Ask your health care provider what activities are safe for you.  Keep all follow-up visits as told by your health care provider. This is important. Contact a health care provider if:  You have muscle cramps, pain, or discomfort, such as: ? Pain in your abdomen and the pain gets worse or stays in one area (localizes). ? Stiff neck.  You have a rash.  You are more irritable than usual.  You are sleepier or have a harder time waking than usual.  You feel weak or dizzy.  You feel very thirsty. Get help right away if you have:  Any symptoms of severe dehydration.  Symptoms of vomiting, such as: ? You cannot eat or drink without vomiting. ? Vomiting gets worse or does not go away. ? Vomit includes blood or green matter (bile).  Symptoms that get worse with treatment.  A fever.  A severe headache.  Problems with urination or bowel movements, such as: ? Diarrhea that gets worse or does not go away. ? Blood in your stool (feces). This  may cause stool to look black and tarry. ? Not urinating, or urinating only a small amount of very dark urine, within 6-8 hours.  Trouble breathing. These symptoms may represent a serious problem that is an emergency. Do not wait to see if the symptoms will go away. Get medical help right away. Call your local emergency services (911 in the [place] Do not drive yourself to the hospital. Summary  Dehydration is a condition in which there is not enough water or other fluids in the body. This happens when a person loses more fluids than he or she takes in.  Treatment for this condition depends on how severe it is. Treatment should be started right away. Do not wait until dehydration becomes severe.  Drink enough clear fluid to keep your urine pale yellow. If you were told to drink an oral rehydration solution (ORS), finish the ORS first and then start slowly drinking other clear fluids.  Take over-the-counter and prescription medicines only as told by your health care   provider.  Get help right away if you have any symptoms of severe dehydration. This information is not intended to replace advice given to you by your health care provider. Make sure you discuss any questions you have with your health care provider. Document Revised: 06/29/2019 Document Reviewed: 06/29/2019 Elsevier Patient Education  2020 Elsevier Inc.   

## 2020-06-07 NOTE — Progress Notes (Addendum)
[place] OFFICE PROGRESS NOTE   Diagnosis: Rectal cancer  INTERVAL HISTORY:   Clinton Cordova returns for follow-up.  He completed cycle 3 FOLFOX 04/25/2020.  Cycle 4 was held on 05/08/2020 due to progressive renal failure.  Renal ultrasound showed persistent left hydronephrosis in the left stent was not visualized.  He was hospitalized at [facility] 05/16/2020 through 05/24/2020 with AKI.  He underwent ureteral stent exchange on 05/22/2020.  He is not feeling well. He reports "no energy" since hospital discharge. Appetite is poor. He is losing weight. He has nausea. No vomiting. No diarrhea. He has intermittent dyspnea. No chest pain. No cough. No leg swelling or calf pain. He feels "achy" like he has the flu. No fever or chills.  Objective:  Vital signs in last 24 hours:  Blood pressure 138/78, pulse 67, temperature 97.9 F (36.6 C), temperature source Temporal, resp. rate 18, height 6' (1.829 m), weight 167 lb 3.2 oz (75.8 kg), SpO2 100 %.    HEENT: No thrush or ulcers. Resp: Lungs clear bilaterally. Cardio: Regular rate and rhythm. GI: Abdomen soft and nontender. Left lower quadrant colostomy. Right lower abdomen drain site without erythema. Vascular: No leg edema. Calves soft and nontender.  Skin: Mild decrease in skin turgor.  Port-A-Cath without erythema.  Lab Results:  Lab Results  Component Value Date   WBC 7.8 06/07/2020   HGB 9.7 (L) 06/07/2020   HCT 31.3 (L) 06/07/2020   MCV 86.5 06/07/2020   PLT 406 (H) 06/07/2020   NEUTROABS 5.3 06/07/2020    Imaging:  No results found.  Medications: I have reviewed the patient's current medications.  Assessment/Plan: 1. Metastatic rectal cancer, perforated rectal tumor with abscess, stage IV ? Presented with "diverticulitis "March 2020, status post drainage catheter ? Colonoscopy 10/17/2019-obstructing mass at 10 cm, biopsy confirmed adenocarcinoma ? MRI pelvis 10/22/2019-T3 versus T4, into, M0 lesion-evaluation  limited due to abscess ? Neoadjuvant radiation-short course ? 01/17/2020-palliative low anterior resection, end colostomy, biopsy of peritoneal nodules, liver biopsy, left ureter stent, right ureter reimplantation, placement of pelvic drains  Operative findings included chronic perforation of the rectum with involvement of the pelvic sidewalls and bladder, 2 peritoneal nodules at the sacral promontory, 1 cm mass at segment 4 of the liver  Pathology confirmeda ypT4n, pN1c,M1c,moderately differentiated adenocarcinoma, lymphovascular and perineural invasion present, treatment response score 2, positive distal and radial margins, 0/15 lymph nodes, 1 tumor deposit, no loss of mismatch repair protein expression, MSS, positive for adenocarcinoma involving the right ureter, segment 4 liver, and peritoneal deposits  CTs chest, abdomen, and pelvis 02/23/2020-2 pelvic surgical drains, 2.1 cm hepatic dome hypodensity, 1.8 cm peripheral segment 6/7 lesion, bilateral ureter stents, mild left hydroureteronephrosis, 2 new right lower lobe 0.3 cm nodules-indeterminate  Cycle 1 FOLFOX 03/28/2020  Cycle 2 FOLFOX 04/11/2020  Cycle 3 FOLFOX 04/25/2020  CT 05/21/2020 at [facility]-(compared to CT abdomen/pelvis 02/23/2020 ) mild to moderate right hydroureteronephrosis increased since prior.  Slight distal migration of the right ureteral stent with the proximal tip terminating in the proximal right ureter.  Mild left hydronephrosis.  Similar to prior.  Unchanged position of left ureteral stent.  Stable sites of mesorectal fascial thickening and decreased size of liver lesions since the comparison study. 2.Diverticulitis/perforated rectal tumor-pelvic drains remain in place 3.Coronary artery disease 4.Chronic systolic heart failure 5.History of anLV thrombus, status post course of Eliquis, no thrombus visualized on echocardiogram 09/15/2019, LVEF 45-50 6.Renal ultrasound 03/15/2020-moderate bilateral  hydronephrosis with ureteral stents in position bilaterally--bilateral stent exchangeDr. Lottie Rater,  urology at [facility] 03/22/2020 7.   Anemia 8.   Renal failure-improved following ureter stent exchange April 2021  Progressive renal failure 05/08/2020-ultrasound with mild to moderate left hydronephrosis, resolution of right hydronephrosis, and left double-J stent was not appreciated  Stent exchange at [facility] 05/22/2020   Disposition: Clinton Cordova has completed 3 cycles of FOLFOX.  He was hospitalized at [facility] 05/16/2020 through 05/24/2020 with AKI.  He underwent ureteral stent exchange 05/22/2020.  He has a poor performance status, etiology unclear.  He is not a candidate for chemotherapy in his current condition.  We are canceling the chemotherapy scheduled for tomorrow.  He appears dehydrated.  He will receive IV fluids today and tomorrow.  We will see him in follow-up with repeat labs on 06/10/2020.  He understands to seek evaluation in the emergency department if his condition worsens prior to his appointment next week.  Patient seen with Dr. Benay Spice.    Ned Card ANP/GNP-BC   06/07/2020  12:11 PM  [name] was interviewed and examined.  He reports malaise following discharge from the hospital 05/24/2020.  A CT in the hospital did not reveal evidence of progressive rectal cancer.  There is no apparent source of infection today.  Renal failure has improved compared to when he was here last month.  FOLFOX will remain on hold.  He will receive intravenous fluids today and tomorrow.  Julieanne Manson, MD

## 2020-06-08 ENCOUNTER — Inpatient Hospital Stay: Payer: Medicare Other

## 2020-06-08 ENCOUNTER — Other Ambulatory Visit: Payer: Self-pay

## 2020-06-08 VITALS — BP 139/77 | HR 75 | Temp 97.8°F | Resp 18

## 2020-06-08 DIAGNOSIS — Z95828 Presence of other vascular implants and grafts: Secondary | ICD-10-CM

## 2020-06-08 DIAGNOSIS — Z5111 Encounter for antineoplastic chemotherapy: Secondary | ICD-10-CM | POA: Diagnosis not present

## 2020-06-08 DIAGNOSIS — C20 Malignant neoplasm of rectum: Secondary | ICD-10-CM

## 2020-06-08 MED ORDER — SODIUM CHLORIDE 0.9 % IV SOLN
INTRAVENOUS | Status: DC
  Filled 2020-06-08 (×2): qty 250

## 2020-06-08 MED ORDER — HEPARIN SOD (PORK) LOCK FLUSH 100 UNIT/ML IV SOLN
500.0000 [IU] | Freq: Once | INTRAVENOUS | Status: AC | PRN
  Administered 2020-06-08: 500 [IU]
  Filled 2020-06-08: qty 5

## 2020-06-08 MED ORDER — SODIUM CHLORIDE 0.9% FLUSH
10.0000 mL | INTRAVENOUS | Status: DC | PRN
  Administered 2020-06-08: 10 mL
  Filled 2020-06-08: qty 10

## 2020-06-08 NOTE — Patient Instructions (Signed)
Dehydration, Adult Dehydration is a condition in which there is not enough water or other fluids in the body. This happens when a person loses more fluids than he or she takes in. Important organs, such as the kidneys, brain, and heart, cannot function without a proper amount of fluids. Any loss of fluids from the body can lead to dehydration. Dehydration can be mild, moderate, or severe. It should be treated right away to prevent it from becoming severe. What are the causes? Dehydration may be caused by:  Conditions that cause loss of water or other fluids, such as diarrhea, vomiting, or sweating or urinating a lot.  Not drinking enough fluids, especially when you are ill or doing activities that require a lot of energy.  Other illnesses and conditions, such as fever or infection.  Certain medicines, such as medicines that remove excess fluid from the body (diuretics).  Lack of safe drinking water.  Not being able to get enough water and food. What increases the risk? The following factors may make you more likely to develop this condition:  Having a long-term (chronic) illness that has not been treated properly, such as diabetes, heart disease, or kidney disease.  Being 65 years of age or older.  Having a disability.  Living in a place that is high in altitude, where thinner, drier air causes more fluid loss.  Doing exercises that put stress on your body for a long time (endurance sports). What are the signs or symptoms? Symptoms of dehydration depend on how severe it is. Mild or moderate dehydration  Thirst.  Dry lips or dry mouth.  Dizziness or light-headedness, especially when standing up from a seated position.  Muscle cramps.  Dark urine. Urine may be the color of tea.  Less urine or tears produced than usual.  Headache. Severe dehydration  Changes in skin. Your skin may be cold and clammy, blotchy, or pale. Your skin also may not return to normal after being  lightly pinched and released.  Little or no tears, urine, or sweat.  Changes in vital signs, such as rapid breathing and low blood pressure. Your pulse may be weak or may be faster than 100 beats a minute when you are sitting still.  Other changes, such as: ? Feeling very thirsty. ? Sunken eyes. ? Cold hands and feet. ? Confusion. ? Being very tired (lethargic) or having trouble waking from sleep. ? Short-term weight loss. ? Loss of consciousness. How is this diagnosed? This condition is diagnosed based on your symptoms and a physical exam. You may have blood and urine tests to help confirm the diagnosis. How is this treated? Treatment for this condition depends on how severe it is. Treatment should be started right away. Do not wait until dehydration becomes severe. Severe dehydration is an emergency and needs to be treated in a hospital.  Mild or moderate dehydration can be treated at home. You may be asked to: ? Drink more fluids. ? Drink an oral rehydration solution (ORS). This drink helps restore proper amounts of fluids and salts and minerals in the blood (electrolytes).  Severe dehydration can be treated: ? With IV fluids. ? By correcting abnormal levels of electrolytes. This is often done by giving electrolytes through a tube that is passed through your nose and into your stomach (nasogastric tube, or NG tube). ? By treating the underlying cause of dehydration. Follow these instructions at home: Oral rehydration solution If told by your health care provider, drink an ORS:  Make   an ORS by following instructions on the package.  Start by drinking small amounts, about  cup (120 mL) every 5-10 minutes.  Slowly increase how much you drink until you have taken the amount recommended by your health care provider. Eating and drinking         Drink enough clear fluid to keep your urine pale yellow. If you were told to drink an ORS, finish the ORS first and then start slowly  drinking other clear fluids. Drink fluids such as: ? Water. Do not drink only water. Doing that can lead to hyponatremia, which is having too little salt (sodium) in the body. ? Water from ice chips you suck on. ? Fruit juice that you have added water to (diluted fruit juice). ? Low-calorie sports drinks.  Eat foods that contain a healthy balance of electrolytes, such as bananas, oranges, potatoes, tomatoes, and spinach.  Do not drink alcohol.  Avoid the following: ? Drinks that contain a lot of sugar. These include high-calorie sports drinks, fruit juice that is not diluted, and soda. ? Caffeine. ? Foods that are greasy or contain a lot of fat or sugar. General instructions  Take over-the-counter and prescription medicines only as told by your health care provider.  Do not take sodium tablets. Doing that can lead to having too much sodium in the body (hypernatremia).  Return to your normal activities as told by your health care provider. Ask your health care provider what activities are safe for you.  Keep all follow-up visits as told by your health care provider. This is important. Contact a health care provider if:  You have muscle cramps, pain, or discomfort, such as: ? Pain in your abdomen and the pain gets worse or stays in one area (localizes). ? Stiff neck.  You have a rash.  You are more irritable than usual.  You are sleepier or have a harder time waking than usual.  You feel weak or dizzy.  You feel very thirsty. Get help right away if you have:  Any symptoms of severe dehydration.  Symptoms of vomiting, such as: ? You cannot eat or drink without vomiting. ? Vomiting gets worse or does not go away. ? Vomit includes blood or green matter (bile).  Symptoms that get worse with treatment.  A fever.  A severe headache.  Problems with urination or bowel movements, such as: ? Diarrhea that gets worse or does not go away. ? Blood in your stool (feces). This  may cause stool to look black and tarry. ? Not urinating, or urinating only a small amount of very dark urine, within 6-8 hours.  Trouble breathing. These symptoms may represent a serious problem that is an emergency. Do not wait to see if the symptoms will go away. Get medical help right away. Call your local emergency services (911 in the [place] Do not drive yourself to the hospital. Summary  Dehydration is a condition in which there is not enough water or other fluids in the body. This happens when a person loses more fluids than he or she takes in.  Treatment for this condition depends on how severe it is. Treatment should be started right away. Do not wait until dehydration becomes severe.  Drink enough clear fluid to keep your urine pale yellow. If you were told to drink an oral rehydration solution (ORS), finish the ORS first and then start slowly drinking other clear fluids.  Take over-the-counter and prescription medicines only as told by your health care   provider.  Get help right away if you have any symptoms of severe dehydration. This information is not intended to replace advice given to you by your health care provider. Make sure you discuss any questions you have with your health care provider. Document Revised: 06/29/2019 Document Reviewed: 06/29/2019 Elsevier Patient Education  2020 Elsevier Inc.   

## 2020-06-10 ENCOUNTER — Inpatient Hospital Stay: Payer: Medicare Other

## 2020-06-12 ENCOUNTER — Telehealth: Payer: Self-pay | Admitting: Nurse Practitioner

## 2020-06-12 NOTE — Telephone Encounter (Signed)
Scheduled per 7/13 staff msg Lattie Haw). Called and spoke with pt, pt unable to come for appt on 7/14, confirmed 7/19 appt

## 2020-06-14 ENCOUNTER — Other Ambulatory Visit: Payer: Self-pay

## 2020-06-17 ENCOUNTER — Other Ambulatory Visit: Payer: Self-pay | Admitting: *Deleted

## 2020-06-17 ENCOUNTER — Inpatient Hospital Stay: Payer: Medicare Other

## 2020-06-17 ENCOUNTER — Other Ambulatory Visit: Payer: Self-pay

## 2020-06-17 ENCOUNTER — Inpatient Hospital Stay (HOSPITAL_BASED_OUTPATIENT_CLINIC_OR_DEPARTMENT_OTHER): Payer: Medicare Other | Admitting: Oncology

## 2020-06-17 VITALS — BP 122/64 | HR 50 | Temp 97.9°F | Resp 17 | Ht 72.0 in | Wt 179.3 lb

## 2020-06-17 DIAGNOSIS — C20 Malignant neoplasm of rectum: Secondary | ICD-10-CM | POA: Diagnosis not present

## 2020-06-17 DIAGNOSIS — Z95828 Presence of other vascular implants and grafts: Secondary | ICD-10-CM

## 2020-06-17 DIAGNOSIS — Z5111 Encounter for antineoplastic chemotherapy: Secondary | ICD-10-CM | POA: Diagnosis not present

## 2020-06-17 LAB — CBC WITH DIFFERENTIAL (CANCER CENTER ONLY)
Abs Immature Granulocytes: 0.02 10*3/uL (ref 0.00–0.07)
Basophils Absolute: 0 10*3/uL (ref 0.0–0.1)
Basophils Relative: 1 %
Eosinophils Absolute: 0.3 10*3/uL (ref 0.0–0.5)
Eosinophils Relative: 6 %
HCT: 29.1 % — ABNORMAL LOW (ref 39.0–52.0)
Hemoglobin: 8.8 g/dL — ABNORMAL LOW (ref 13.0–17.0)
Immature Granulocytes: 0 %
Lymphocytes Relative: 14 %
Lymphs Abs: 0.9 10*3/uL (ref 0.7–4.0)
MCH: 26.2 pg (ref 26.0–34.0)
MCHC: 30.2 g/dL (ref 30.0–36.0)
MCV: 86.6 fL (ref 80.0–100.0)
Monocytes Absolute: 0.7 10*3/uL (ref 0.1–1.0)
Monocytes Relative: 11 %
Neutro Abs: 4.1 10*3/uL (ref 1.7–7.7)
Neutrophils Relative %: 68 %
Platelet Count: 228 10*3/uL (ref 150–400)
RBC: 3.36 MIL/uL — ABNORMAL LOW (ref 4.22–5.81)
RDW: 18.2 % — ABNORMAL HIGH (ref 11.5–15.5)
WBC Count: 6.1 10*3/uL (ref 4.0–10.5)
nRBC: 0 % (ref 0.0–0.2)

## 2020-06-17 LAB — CMP (CANCER CENTER ONLY)
ALT: 9 U/L (ref 0–44)
AST: 10 U/L — ABNORMAL LOW (ref 15–41)
Albumin: 2.9 g/dL — ABNORMAL LOW (ref 3.5–5.0)
Alkaline Phosphatase: 87 U/L (ref 38–126)
Anion gap: 7 (ref 5–15)
BUN: 18 mg/dL (ref 8–23)
CO2: 20 mmol/L — ABNORMAL LOW (ref 22–32)
Calcium: 9.2 mg/dL (ref 8.9–10.3)
Chloride: 112 mmol/L — ABNORMAL HIGH (ref 98–111)
Creatinine: 1.7 mg/dL — ABNORMAL HIGH (ref 0.61–1.24)
GFR, Est AFR Am: 48 mL/min — ABNORMAL LOW (ref >60)
GFR, Estimated: 41 mL/min — ABNORMAL LOW (ref >60)
Glucose, Bld: 108 mg/dL — ABNORMAL HIGH (ref 70–99)
Potassium: 4 mmol/L (ref 3.5–5.1)
Sodium: 139 mmol/L (ref 135–145)
Total Bilirubin: <0.2 mg/dL — ABNORMAL LOW (ref 0.3–1.2)
Total Protein: 7.1 g/dL (ref 6.5–8.1)

## 2020-06-17 MED ORDER — LEUCOVORIN CALCIUM INJECTION 350 MG
400.0000 mg/m2 | Freq: Once | INTRAVENOUS | Status: AC
  Administered 2020-06-17: 776 mg via INTRAVENOUS
  Filled 2020-06-17: qty 38.8

## 2020-06-17 MED ORDER — SODIUM CHLORIDE 0.9% FLUSH
10.0000 mL | INTRAVENOUS | Status: DC | PRN
  Filled 2020-06-17: qty 10

## 2020-06-17 MED ORDER — SODIUM CHLORIDE 0.9 % IV SOLN
2400.0000 mg/m2 | INTRAVENOUS | Status: DC
  Administered 2020-06-17: 4650 mg via INTRAVENOUS
  Filled 2020-06-17: qty 93

## 2020-06-17 MED ORDER — SODIUM CHLORIDE 0.9% FLUSH
10.0000 mL | INTRAVENOUS | Status: DC | PRN
  Administered 2020-06-17: 10 mL
  Filled 2020-06-17: qty 10

## 2020-06-17 MED ORDER — FLUOROURACIL CHEMO INJECTION 2.5 GM/50ML
400.0000 mg/m2 | Freq: Once | INTRAVENOUS | Status: AC
  Administered 2020-06-17: 800 mg via INTRAVENOUS
  Filled 2020-06-17: qty 16

## 2020-06-17 MED ORDER — DEXTROSE 5 % IV SOLN
Freq: Once | INTRAVENOUS | Status: AC
  Filled 2020-06-17: qty 250

## 2020-06-17 MED ORDER — SODIUM CHLORIDE 0.9 % IV SOLN
10.0000 mg | Freq: Once | INTRAVENOUS | Status: AC
  Administered 2020-06-17: 10 mg via INTRAVENOUS
  Filled 2020-06-17: qty 10

## 2020-06-17 MED ORDER — HEPARIN SOD (PORK) LOCK FLUSH 100 UNIT/ML IV SOLN
500.0000 [IU] | Freq: Once | INTRAVENOUS | Status: AC | PRN
  Administered 2020-06-17: 500 [IU]
  Filled 2020-06-17: qty 5

## 2020-06-17 MED ORDER — OXALIPLATIN CHEMO INJECTION 100 MG/20ML
85.0000 mg/m2 | Freq: Once | INTRAVENOUS | Status: AC
  Administered 2020-06-17: 165 mg via INTRAVENOUS
  Filled 2020-06-17: qty 33

## 2020-06-17 MED ORDER — HEPARIN SOD (PORK) LOCK FLUSH 100 UNIT/ML IV SOLN
500.0000 [IU] | Freq: Once | INTRAVENOUS | Status: DC | PRN
  Filled 2020-06-17: qty 5

## 2020-06-17 MED ORDER — PALONOSETRON HCL INJECTION 0.25 MG/5ML
INTRAVENOUS | Status: AC
  Filled 2020-06-17: qty 5

## 2020-06-17 MED ORDER — PALONOSETRON HCL INJECTION 0.25 MG/5ML
0.2500 mg | Freq: Once | INTRAVENOUS | Status: AC
  Administered 2020-06-17: 0.25 mg via INTRAVENOUS

## 2020-06-17 NOTE — Progress Notes (Signed)
Clinton Cordova OFFICE PROGRESS NOTE   Diagnosis: Rectal cancer  INTERVAL HISTORY:   Clinton Cordova did not receive chemotherapy when he was here on 06/07/2020.  His performance status was poor.  He received intravenous fluids on 06/07/2020 and 06/08/2020.  He did not return for scheduled follow-up last week.  He saw Clinton Cordova on 06/12/2020.  He reports feeling much better after the IV fluids 2 weeks ago.  Good appetite.  No neuropathy symptoms.  No complaint.  The colostomy is functioning.  He continues to have drainage from the pelvic drain. Objective:  Vital signs in last 24 hours:  Blood pressure 122/64, pulse (!) 50, temperature 97.9 F (36.6 C), temperature source Temporal, resp. rate 17, height 6' (1.829 m), weight 179 lb 4.8 oz (81.3 kg), SpO2 100 %.    HEENT: No thrush or ulcers Resp: Lungs clear bilaterally Cardio: Regular rate and rhythm GI: No hepatomegaly, nontender, left lower quadrant colostomy with brown formed stool Vascular: No leg edema  Skin: Palms without erythema  Portacath/PICC-without erythema  Lab Results:  Lab Results  Component Value Date   WBC 6.1 06/17/2020   HGB 8.8 (L) 06/17/2020   HCT 29.1 (L) 06/17/2020   MCV 86.6 06/17/2020   PLT 228 06/17/2020   NEUTROABS 4.1 06/17/2020    CMP  Lab Results  Component Value Date   NA 135 06/07/2020   K 4.3 06/07/2020   CL 106 06/07/2020   CO2 17 (L) 06/07/2020   GLUCOSE 102 (H) 06/07/2020   BUN 26 (H) 06/07/2020   CREATININE 2.15 (H) 06/07/2020   CALCIUM 9.8 06/07/2020   PROT 8.0 06/07/2020   ALBUMIN 3.2 (L) 06/07/2020   AST 12 (L) 06/07/2020   ALT 9 06/07/2020   ALKPHOS 109 06/07/2020   BILITOT 0.2 (L) 06/07/2020   GFRNONAA 31 (L) 06/07/2020   GFRAA 36 (L) 06/07/2020    Lab Results  Component Value Date   CEA1 3.28 03/14/2020     Medications: I have reviewed the patient's current medications.   Assessment/Plan: 1. Metastatic rectal cancer, perforated rectal tumor with  abscess, stage IV ? Presented with "diverticulitis "March 2020, status post drainage catheter ? Colonoscopy 10/17/2019-obstructing mass at 10 cm, biopsy confirmed adenocarcinoma ? MRI pelvis 10/22/2019-T3 versus T4, into, M0 lesion-evaluation limited due to abscess ? Neoadjuvant radiation-short course ? 01/17/2020-palliative low anterior resection, end colostomy, biopsy of peritoneal nodules, liver biopsy, left ureter stent, right ureter reimplantation, placement of pelvic drains  Operative findings included chronic perforation of the rectum with involvement of the pelvic sidewalls and bladder, 2 peritoneal nodules at the sacral promontory, 1 cm mass at segment 4 of the liver  Pathology confirmeda ypT4n, pN1c,M1c,moderately differentiated adenocarcinoma, lymphovascular and perineural invasion present, treatment response score 2, positive distal and radial margins, 0/15 lymph nodes, 1 tumor deposit, no loss of mismatch repair protein expression, MSS, positive for adenocarcinoma involving the right ureter, segment 4 liver, and peritoneal deposits  CTs chest, abdomen, and pelvis 02/23/2020-2 pelvic surgical drains, 2.1 cm hepatic dome hypodensity, 1.8 cm peripheral segment 6/7 lesion, bilateral ureter stents, mild left hydroureteronephrosis, 2 new right lower lobe 0.3 cm nodules-indeterminate  Cycle 1 FOLFOX 03/28/2020  Cycle 2 FOLFOX 04/11/2020  Cycle 3 FOLFOX 04/25/2020  CT 05/21/2020 at [facility]-(compared to CT abdomen/pelvis 02/23/2020 ) mild to moderate right hydroureteronephrosis increased since prior.  Slight distal migration of the right ureteral stent with the proximal tip terminating in the proximal right ureter.  Mild left hydronephrosis.  Similar to prior.  Unchanged position of left ureteral stent.  Stable sites of mesorectal fascial thickening and decreased size of liver lesions since the comparison study.  Cycle 4 FOLFOX 06/17/2020 2.Diverticulitis/perforated rectal tumor-pelvic drains  remain in place 3.Coronary artery disease 4.Chronic systolic heart failure 5.History of anLV thrombus, status post course of Eliquis, no thrombus visualized on echocardiogram 09/15/2019, LVEF 45-50 6.Renal ultrasound 03/15/2020-moderate bilateral hydronephrosis with ureteral stents in position bilaterally--bilateral stent exchangeDr. Lottie Rater, urology at [facility] 03/22/2020 7.   Anemia 8.   Renal failure-improved following ureter stent exchange April 2021  Progressive renal failure 05/08/2020-ultrasound with mild to moderate left hydronephrosis, resolution of right hydronephrosis, and left double-J stent was not appreciated  Stent exchange at [facility] 05/22/2020    Disposition: Clinton Cordova appears well.  His performance status is much improved compared to when we saw him 2 weeks ago.  The plan is to resume FOLFOX chemotherapy.  He will undergo restaging CTs at [facility] after 3-4 more cycles of FOLFOX.  Clinton Cordova will return for an office visit and chemotherapy in 2 weeks.  Betsy Coder, MD  06/17/2020  10:07 AM

## 2020-06-17 NOTE — Patient Instructions (Signed)
[place] Discharge Instructions for Patients Receiving Chemotherapy  Today you received the following chemotherapy agents:  Leucovorin, Oxaliplatin, & Fluorouracil.   To help prevent nausea and vomiting after your treatment, we encourage you to take your nausea medication as prescribed.  If you develop nausea and vomiting that is not controlled by your nausea medication, call the clinic.   BELOW ARE SYMPTOMS THAT SHOULD BE REPORTED IMMEDIATELY:  *FEVER GREATER THAN 100.5 F  *CHILLS WITH OR WITHOUT FEVER  NAUSEA AND VOMITING THAT IS NOT CONTROLLED WITH YOUR NAUSEA MEDICATION  *UNUSUAL SHORTNESS OF BREATH  *UNUSUAL BRUISING OR BLEEDING  TENDERNESS IN MOUTH AND THROAT WITH OR WITHOUT PRESENCE OF ULCERS  *URINARY PROBLEMS  *BOWEL PROBLEMS  UNUSUAL RASH Items with * indicate a potential emergency and should be followed up as soon as possible.  Feel free to call the clinic should you have any questions or concerns. The clinic phone number is [phone].  Please show the [place] at check-in to the Emergency Department and triage nurse.

## 2020-06-17 NOTE — Progress Notes (Signed)
Per Dr. Benay Spice: OK to treat w/creatinine of 1.70 (much improved).

## 2020-06-18 ENCOUNTER — Telehealth: Payer: Self-pay | Admitting: Oncology

## 2020-06-18 NOTE — Telephone Encounter (Signed)
Scheduled appts per 7/19 los. Pt confirmed appt date and time.

## 2020-06-19 ENCOUNTER — Other Ambulatory Visit: Payer: Self-pay

## 2020-06-19 ENCOUNTER — Inpatient Hospital Stay: Payer: Medicare Other

## 2020-06-19 VITALS — BP 143/75 | HR 84 | Temp 98.6°F | Resp 16

## 2020-06-19 DIAGNOSIS — C20 Malignant neoplasm of rectum: Secondary | ICD-10-CM

## 2020-06-19 DIAGNOSIS — Z5111 Encounter for antineoplastic chemotherapy: Secondary | ICD-10-CM | POA: Diagnosis not present

## 2020-06-19 MED ORDER — HEPARIN SOD (PORK) LOCK FLUSH 100 UNIT/ML IV SOLN
500.0000 [IU] | Freq: Once | INTRAVENOUS | Status: AC | PRN
  Administered 2020-06-19: 500 [IU]
  Filled 2020-06-19: qty 5

## 2020-06-19 MED ORDER — SODIUM CHLORIDE 0.9% FLUSH
10.0000 mL | INTRAVENOUS | Status: DC | PRN
  Administered 2020-06-19: 10 mL
  Filled 2020-06-19: qty 10

## 2020-06-19 NOTE — Patient Instructions (Signed)

## 2020-06-29 ENCOUNTER — Other Ambulatory Visit: Payer: Self-pay | Admitting: Oncology

## 2020-07-01 ENCOUNTER — Inpatient Hospital Stay: Payer: Medicare Other | Attending: Oncology

## 2020-07-01 ENCOUNTER — Inpatient Hospital Stay (HOSPITAL_BASED_OUTPATIENT_CLINIC_OR_DEPARTMENT_OTHER): Payer: Medicare Other | Admitting: Oncology

## 2020-07-01 ENCOUNTER — Inpatient Hospital Stay: Payer: Medicare Other

## 2020-07-01 ENCOUNTER — Other Ambulatory Visit: Payer: Self-pay

## 2020-07-01 VITALS — BP 146/72 | HR 70 | Temp 98.1°F | Resp 17 | Ht 72.0 in | Wt 182.3 lb

## 2020-07-01 DIAGNOSIS — C20 Malignant neoplasm of rectum: Secondary | ICD-10-CM

## 2020-07-01 DIAGNOSIS — Z95828 Presence of other vascular implants and grafts: Secondary | ICD-10-CM

## 2020-07-01 DIAGNOSIS — Z79899 Other long term (current) drug therapy: Secondary | ICD-10-CM | POA: Insufficient documentation

## 2020-07-01 DIAGNOSIS — D649 Anemia, unspecified: Secondary | ICD-10-CM | POA: Insufficient documentation

## 2020-07-01 DIAGNOSIS — Z5111 Encounter for antineoplastic chemotherapy: Secondary | ICD-10-CM | POA: Diagnosis present

## 2020-07-01 DIAGNOSIS — Z933 Colostomy status: Secondary | ICD-10-CM | POA: Diagnosis not present

## 2020-07-01 LAB — CMP (CANCER CENTER ONLY)
ALT: 7 U/L (ref 0–44)
AST: 10 U/L — ABNORMAL LOW (ref 15–41)
Albumin: 3.3 g/dL — ABNORMAL LOW (ref 3.5–5.0)
Alkaline Phosphatase: 83 U/L (ref 38–126)
Anion gap: 8 (ref 5–15)
BUN: 20 mg/dL (ref 8–23)
CO2: 20 mmol/L — ABNORMAL LOW (ref 22–32)
Calcium: 9.5 mg/dL (ref 8.9–10.3)
Chloride: 109 mmol/L (ref 98–111)
Creatinine: 1.49 mg/dL — ABNORMAL HIGH (ref 0.61–1.24)
GFR, Est AFR Am: 56 mL/min — ABNORMAL LOW (ref >60)
GFR, Estimated: 48 mL/min — ABNORMAL LOW (ref >60)
Glucose, Bld: 112 mg/dL — ABNORMAL HIGH (ref 70–99)
Potassium: 4.3 mmol/L (ref 3.5–5.1)
Sodium: 137 mmol/L (ref 135–145)
Total Bilirubin: 0.3 mg/dL (ref 0.3–1.2)
Total Protein: 7 g/dL (ref 6.5–8.1)

## 2020-07-01 LAB — CBC WITH DIFFERENTIAL (CANCER CENTER ONLY)
Abs Immature Granulocytes: 0 10*3/uL (ref 0.00–0.07)
Basophils Absolute: 0 10*3/uL (ref 0.0–0.1)
Basophils Relative: 1 %
Eosinophils Absolute: 0.2 10*3/uL (ref 0.0–0.5)
Eosinophils Relative: 5 %
HCT: 30.5 % — ABNORMAL LOW (ref 39.0–52.0)
Hemoglobin: 9.3 g/dL — ABNORMAL LOW (ref 13.0–17.0)
Immature Granulocytes: 0 %
Lymphocytes Relative: 26 %
Lymphs Abs: 0.8 10*3/uL (ref 0.7–4.0)
MCH: 27.3 pg (ref 26.0–34.0)
MCHC: 30.5 g/dL (ref 30.0–36.0)
MCV: 89.4 fL (ref 80.0–100.0)
Monocytes Absolute: 0.5 10*3/uL (ref 0.1–1.0)
Monocytes Relative: 14 %
Neutro Abs: 1.8 10*3/uL (ref 1.7–7.7)
Neutrophils Relative %: 54 %
Platelet Count: 244 10*3/uL (ref 150–400)
RBC: 3.41 MIL/uL — ABNORMAL LOW (ref 4.22–5.81)
RDW: 19.2 % — ABNORMAL HIGH (ref 11.5–15.5)
WBC Count: 3.3 10*3/uL — ABNORMAL LOW (ref 4.0–10.5)
nRBC: 0 % (ref 0.0–0.2)

## 2020-07-01 LAB — CEA (IN HOUSE-CHCC): CEA (CHCC-In House): 3.97 ng/mL (ref 0.00–5.00)

## 2020-07-01 LAB — MAGNESIUM: Magnesium: 2.1 mg/dL (ref 1.7–2.4)

## 2020-07-01 MED ORDER — DEXTROSE 5 % IV SOLN
Freq: Once | INTRAVENOUS | Status: AC
  Filled 2020-07-01: qty 250

## 2020-07-01 MED ORDER — PALONOSETRON HCL INJECTION 0.25 MG/5ML
0.2500 mg | Freq: Once | INTRAVENOUS | Status: AC
  Administered 2020-07-01: 0.25 mg via INTRAVENOUS

## 2020-07-01 MED ORDER — PALONOSETRON HCL INJECTION 0.25 MG/5ML
INTRAVENOUS | Status: AC
  Filled 2020-07-01: qty 5

## 2020-07-01 MED ORDER — SODIUM CHLORIDE 0.9% FLUSH
10.0000 mL | INTRAVENOUS | Status: DC | PRN
  Administered 2020-07-01: 10 mL
  Filled 2020-07-01: qty 10

## 2020-07-01 MED ORDER — SODIUM CHLORIDE 0.9 % IV SOLN
2400.0000 mg/m2 | INTRAVENOUS | Status: DC
  Administered 2020-07-01: 4650 mg via INTRAVENOUS
  Filled 2020-07-01: qty 93

## 2020-07-01 MED ORDER — LEUCOVORIN CALCIUM INJECTION 350 MG
400.0000 mg/m2 | Freq: Once | INTRAVENOUS | Status: AC
  Administered 2020-07-01: 776 mg via INTRAVENOUS
  Filled 2020-07-01: qty 38.8

## 2020-07-01 MED ORDER — FLUOROURACIL CHEMO INJECTION 2.5 GM/50ML
400.0000 mg/m2 | Freq: Once | INTRAVENOUS | Status: AC
  Administered 2020-07-01: 800 mg via INTRAVENOUS
  Filled 2020-07-01: qty 16

## 2020-07-01 MED ORDER — SODIUM CHLORIDE 0.9 % IV SOLN
10.0000 mg | Freq: Once | INTRAVENOUS | Status: AC
  Administered 2020-07-01: 10 mg via INTRAVENOUS
  Filled 2020-07-01: qty 10

## 2020-07-01 MED ORDER — OXALIPLATIN CHEMO INJECTION 100 MG/20ML
85.0000 mg/m2 | Freq: Once | INTRAVENOUS | Status: AC
  Administered 2020-07-01: 165 mg via INTRAVENOUS
  Filled 2020-07-01: qty 33

## 2020-07-01 NOTE — Patient Instructions (Signed)
[place] Discharge Instructions for Patients Receiving Chemotherapy  Today you received the following chemotherapy agents:  Leucovorin, Oxaliplatin, & Fluorouracil.   To help prevent nausea and vomiting after your treatment, we encourage you to take your nausea medication as prescribed.  If you develop nausea and vomiting that is not controlled by your nausea medication, call the clinic.   BELOW ARE SYMPTOMS THAT SHOULD BE REPORTED IMMEDIATELY:  *FEVER GREATER THAN 100.5 F  *CHILLS WITH OR WITHOUT FEVER  NAUSEA AND VOMITING THAT IS NOT CONTROLLED WITH YOUR NAUSEA MEDICATION  *UNUSUAL SHORTNESS OF BREATH  *UNUSUAL BRUISING OR BLEEDING  TENDERNESS IN MOUTH AND THROAT WITH OR WITHOUT PRESENCE OF ULCERS  *URINARY PROBLEMS  *BOWEL PROBLEMS  UNUSUAL RASH Items with * indicate a potential emergency and should be followed up as soon as possible.  Feel free to call the clinic should you have any questions or concerns. The clinic phone number is [phone].  Please show the [place] at check-in to the Emergency Department and triage nurse.

## 2020-07-01 NOTE — Progress Notes (Signed)
[place] OFFICE PROGRESS NOTE   Diagnosis: Rectal cancer  INTERVAL HISTORY:   Clinton Cordova completed another cycle of FOLFOX on 06/17/2020.  No nausea/vomiting, mouth sores, or diarrhea.  Cold sensitivity lasted 3 days following chemotherapy.  No neuropathy symptoms at present.  He has mild burning with urination.  He empties 15-20 cc from the pelvic drain daily.  Objective:  Vital signs in last 24 hours:  Blood pressure (!) 146/72, pulse 70, temperature 98.1 F (36.7 C), temperature source Temporal, resp. rate 17, height 6' (1.829 m), weight 182 lb 4.8 oz (82.7 kg), SpO2 97 %.    HEENT: No thrush or ulcers Resp: Lungs clear bilaterally Cardio: Regular rate and rhythm GI: No hepatosplenomegaly, nontender, left lower quadrant colostomy, right pelvic drain with a small amount of light tan fluid Vascular: No leg edema  Skin: Palms without erythema  Portacath/PICC-without erythema  Lab Results:  Lab Results  Component Value Date   WBC 3.3 (L) 07/01/2020   HGB 9.3 (L) 07/01/2020   HCT 30.5 (L) 07/01/2020   MCV 89.4 07/01/2020   PLT 244 07/01/2020   NEUTROABS 1.8 07/01/2020    CMP  Lab Results  Component Value Date   NA 139 06/17/2020   K 4.0 06/17/2020   CL 112 (H) 06/17/2020   CO2 20 (L) 06/17/2020   GLUCOSE 108 (H) 06/17/2020   BUN 18 06/17/2020   CREATININE 1.70 (H) 06/17/2020   CALCIUM 9.2 06/17/2020   PROT 7.1 06/17/2020   ALBUMIN 2.9 (L) 06/17/2020   AST 10 (L) 06/17/2020   ALT 9 06/17/2020   ALKPHOS 87 06/17/2020   BILITOT <0.2 (L) 06/17/2020   GFRNONAA 41 (L) 06/17/2020   GFRAA 48 (L) 06/17/2020    Lab Results  Component Value Date   CEA1 3.28 03/14/2020    Medications: I have reviewed the patient's current medications.   Assessment/Plan: 1. Metastatic rectal cancer, perforated rectal tumor with abscess, stage IV ? Presented with "diverticulitis "March 2020, status post drainage catheter ? Colonoscopy 10/17/2019-obstructing  mass at 10 cm, biopsy confirmed adenocarcinoma ? MRI pelvis 10/22/2019-T3 versus T4, into, M0 lesion-evaluation limited due to abscess ? Neoadjuvant radiation-short course ? 01/17/2020-palliative low anterior resection, end colostomy, biopsy of peritoneal nodules, liver biopsy, left ureter stent, right ureter reimplantation, placement of pelvic drains  Operative findings included chronic perforation of the rectum with involvement of the pelvic sidewalls and bladder, 2 peritoneal nodules at the sacral promontory, 1 cm mass at segment 4 of the liver  Pathology confirmeda ypT4n, pN1c,M1c,moderately differentiated adenocarcinoma, lymphovascular and perineural invasion present, treatment response score 2, positive distal and radial margins, 0/15 lymph nodes, 1 tumor deposit, no loss of mismatch repair protein expression, MSS, positive for adenocarcinoma involving the right ureter, segment 4 liver, and peritoneal deposits  CTs chest, abdomen, and pelvis 02/23/2020-2 pelvic surgical drains, 2.1 cm hepatic dome hypodensity, 1.8 cm peripheral segment 6/7 lesion, bilateral ureter stents, mild left hydroureteronephrosis, 2 new right lower lobe 0.3 cm nodules-indeterminate  Cycle 1 FOLFOX 03/28/2020  Cycle 2 FOLFOX 04/11/2020  Cycle 3 FOLFOX 04/25/2020  CT 05/21/2020 at [facility]-(compared to CT abdomen/pelvis 02/23/2020 ) mild to moderate right hydroureteronephrosis increased since prior.  Slight distal migration of the right ureteral stent with the proximal tip terminating in the proximal right ureter.  Mild left hydronephrosis.  Similar to prior.  Unchanged position of left ureteral stent.  Stable sites of mesorectal fascial thickening and decreased size of liver lesions since the comparison study.  Cycle 4 FOLFOX  06/17/2020  Cycle 5 FOLFOX 07/01/2020 2.Diverticulitis/perforated rectal tumor-pelvic drains remain in place 3.Coronary artery disease 4.Chronic systolic heart failure 5.History of anLV  thrombus, status post course of Eliquis, no thrombus visualized on echocardiogram 09/15/2019, LVEF 45-50 6.Renal ultrasound 03/15/2020-moderate bilateral hydronephrosis with ureteral stents in position bilaterally--bilateral stent exchangeDr. Lottie Rater, urology at [facility] 03/22/2020 7.   Anemia 8.   Renal failure-improved following ureter stent exchange April 2021  Progressive renal failure 05/08/2020-ultrasound with mild to moderate left hydronephrosis, resolution of right hydronephrosis, and left double-J stent was not appreciated  Stent exchange at [facility] 05/22/2020      Disposition: Clinton Cordova appears stable.  He is tolerating the FOLFOX well.  He will complete cycle 5 FOLFOX today.  [name] will return for an office visit and chemotherapy in 2 weeks.  The plan is to complete 7 cycles of FOLFOX prior to a restaging CT evaluation at [facility] on 08/14/2020.  I encouraged [name] to take the COVID-19 vaccine.  We will schedule him to receive the vaccine here on 07/03/2020.  He was given vaccine written materials today.  Betsy Coder, MD  07/01/2020  10:02 AM

## 2020-07-02 ENCOUNTER — Telehealth: Payer: Self-pay | Admitting: Oncology

## 2020-07-02 NOTE — Telephone Encounter (Signed)
Scheduled appts per 8/2 los. Pt to get updated appt calendar at next visit per appt notes.

## 2020-07-03 ENCOUNTER — Other Ambulatory Visit: Payer: Self-pay

## 2020-07-03 ENCOUNTER — Inpatient Hospital Stay: Payer: Medicare Other

## 2020-07-03 VITALS — BP 151/73 | HR 66 | Temp 98.5°F | Resp 18

## 2020-07-03 DIAGNOSIS — Z5111 Encounter for antineoplastic chemotherapy: Secondary | ICD-10-CM | POA: Diagnosis not present

## 2020-07-03 DIAGNOSIS — C20 Malignant neoplasm of rectum: Secondary | ICD-10-CM

## 2020-07-03 MED ORDER — SODIUM CHLORIDE 0.9% FLUSH
10.0000 mL | INTRAVENOUS | Status: DC | PRN
  Administered 2020-07-03: 10 mL
  Filled 2020-07-03: qty 10

## 2020-07-03 MED ORDER — HEPARIN SOD (PORK) LOCK FLUSH 100 UNIT/ML IV SOLN
500.0000 [IU] | Freq: Once | INTRAVENOUS | Status: AC | PRN
  Administered 2020-07-03: 500 [IU]
  Filled 2020-07-03: qty 5

## 2020-07-13 ENCOUNTER — Other Ambulatory Visit: Payer: Self-pay | Admitting: Oncology

## 2020-07-15 ENCOUNTER — Inpatient Hospital Stay: Payer: Medicare Other | Admitting: Nurse Practitioner

## 2020-07-15 ENCOUNTER — Inpatient Hospital Stay: Payer: Medicare Other

## 2020-07-15 ENCOUNTER — Telehealth: Payer: Self-pay

## 2020-07-15 NOTE — Telephone Encounter (Signed)
Returned phone call to Clinton Cordova about canceling appointment today. Patient states that he has a serious UTI and is going to [facility]. Asking if we could see him here for his UTI. He states that he has already been seen at [facility] and his stent may have to be replaced. Patient states that he will let us know when he is not server pain.

## 2020-07-28 ENCOUNTER — Other Ambulatory Visit: Payer: Self-pay | Admitting: Oncology

## 2020-07-29 ENCOUNTER — Other Ambulatory Visit: Payer: Self-pay

## 2020-07-29 ENCOUNTER — Inpatient Hospital Stay: Payer: Medicare Other

## 2020-07-29 ENCOUNTER — Inpatient Hospital Stay (HOSPITAL_BASED_OUTPATIENT_CLINIC_OR_DEPARTMENT_OTHER): Payer: Medicare Other | Admitting: Oncology

## 2020-07-29 VITALS — BP 133/61 | HR 74 | Temp 97.7°F | Resp 18 | Ht 72.0 in | Wt 177.3 lb

## 2020-07-29 DIAGNOSIS — Z95828 Presence of other vascular implants and grafts: Secondary | ICD-10-CM

## 2020-07-29 DIAGNOSIS — C20 Malignant neoplasm of rectum: Secondary | ICD-10-CM

## 2020-07-29 DIAGNOSIS — Z5111 Encounter for antineoplastic chemotherapy: Secondary | ICD-10-CM | POA: Diagnosis not present

## 2020-07-29 LAB — CBC WITH DIFFERENTIAL (CANCER CENTER ONLY)
Abs Immature Granulocytes: 0.05 10*3/uL (ref 0.00–0.07)
Basophils Absolute: 0 10*3/uL (ref 0.0–0.1)
Basophils Relative: 0 %
Eosinophils Absolute: 0.3 10*3/uL (ref 0.0–0.5)
Eosinophils Relative: 3 %
HCT: 27.8 % — ABNORMAL LOW (ref 39.0–52.0)
Hemoglobin: 8.2 g/dL — ABNORMAL LOW (ref 13.0–17.0)
Immature Granulocytes: 1 %
Lymphocytes Relative: 9 %
Lymphs Abs: 0.7 10*3/uL (ref 0.7–4.0)
MCH: 25.5 pg — ABNORMAL LOW (ref 26.0–34.0)
MCHC: 29.5 g/dL — ABNORMAL LOW (ref 30.0–36.0)
MCV: 86.6 fL (ref 80.0–100.0)
Monocytes Absolute: 0.8 10*3/uL (ref 0.1–1.0)
Monocytes Relative: 9 %
Neutro Abs: 6.3 10*3/uL (ref 1.7–7.7)
Neutrophils Relative %: 78 %
Platelet Count: 390 10*3/uL (ref 150–400)
RBC: 3.21 MIL/uL — ABNORMAL LOW (ref 4.22–5.81)
RDW: 17.2 % — ABNORMAL HIGH (ref 11.5–15.5)
WBC Count: 8.2 10*3/uL (ref 4.0–10.5)
nRBC: 0 % (ref 0.0–0.2)

## 2020-07-29 LAB — CEA (IN HOUSE-CHCC): CEA (CHCC-In House): 2.86 ng/mL (ref 0.00–5.00)

## 2020-07-29 LAB — CMP (CANCER CENTER ONLY)
ALT: 7 U/L (ref 0–44)
AST: 8 U/L — ABNORMAL LOW (ref 15–41)
Albumin: 2.5 g/dL — ABNORMAL LOW (ref 3.5–5.0)
Alkaline Phosphatase: 72 U/L (ref 38–126)
Anion gap: 11 (ref 5–15)
BUN: 42 mg/dL — ABNORMAL HIGH (ref 8–23)
CO2: 16 mmol/L — ABNORMAL LOW (ref 22–32)
Calcium: 10.2 mg/dL (ref 8.9–10.3)
Chloride: 107 mmol/L (ref 98–111)
Creatinine: 3.77 mg/dL (ref 0.61–1.24)
GFR, Est AFR Am: 18 mL/min — ABNORMAL LOW (ref >60)
GFR, Estimated: 16 mL/min — ABNORMAL LOW (ref >60)
Glucose, Bld: 129 mg/dL — ABNORMAL HIGH (ref 70–99)
Potassium: 3.9 mmol/L (ref 3.5–5.1)
Sodium: 134 mmol/L — ABNORMAL LOW (ref 135–145)
Total Bilirubin: 0.2 mg/dL — ABNORMAL LOW (ref 0.3–1.2)
Total Protein: 7.9 g/dL (ref 6.5–8.1)

## 2020-07-29 MED ORDER — SODIUM CHLORIDE 0.9% FLUSH
10.0000 mL | INTRAVENOUS | Status: AC | PRN
  Administered 2020-07-29: 10 mL
  Filled 2020-07-29: qty 10

## 2020-07-29 MED ORDER — HEPARIN SOD (PORK) LOCK FLUSH 100 UNIT/ML IV SOLN
500.0000 [IU] | INTRAVENOUS | Status: AC | PRN
  Administered 2020-07-29: 500 [IU]
  Filled 2020-07-29: qty 5

## 2020-07-29 MED ORDER — SODIUM CHLORIDE 0.9% FLUSH
10.0000 mL | INTRAVENOUS | Status: DC | PRN
  Administered 2020-07-29: 10 mL
  Filled 2020-07-29: qty 10

## 2020-07-29 MED ORDER — DEXAMETHASONE 4 MG PO TABS
ORAL_TABLET | ORAL | Status: AC
  Filled 2020-07-29: qty 3

## 2020-07-29 NOTE — Patient Instructions (Signed)

## 2020-07-29 NOTE — Progress Notes (Signed)
Clinton Cordova   Diagnosis: Colon cancer  INTERVAL HISTORY:   Clinton Cordova was last treated with FOLFOX on 07/01/2020.  He missed the last scheduled cycle of chemotherapy due to a "urinary tract infection ".  He has cold sensitivity for a few days following chemotherapy.  No other neuropathy symptoms.  He feels well today.  He was treated with a course of Bactrim beginning 07/16/2020.  The pelvic drain came out when he was in bed.  He is scheduled for ureter stent exchange at [facility] in 2 weeks.  He takes Imodium once daily for diarrhea.  Objective:  Vital signs in last 24 hours:  Blood pressure 133/61, pulse 74, temperature 97.7 F (36.5 C), temperature source Axillary, resp. rate 18, height 6' (1.829 m), weight 177 lb 4.8 oz (80.4 kg), SpO2 100 %.    HEENT: No thrush Resp: Lungs clear bilaterally Cardio: Regular rate and rhythm GI: No hepatosplenomegaly, nontender, left lower quadrant colostomy Vascular: No leg edema Neuro: The vibratory sense is intact at the fingertips bilaterally   Portacath/PICC-without erythema  Lab Results:  Lab Results  Component Value Date   WBC 8.2 07/29/2020   HGB 8.2 (L) 07/29/2020   HCT 27.8 (L) 07/29/2020   MCV 86.6 07/29/2020   PLT 390 07/29/2020   NEUTROABS 6.3 07/29/2020    CMP  Lab Results  Component Value Date   NA 134 (L) 07/29/2020   K 3.9 07/29/2020   CL 107 07/29/2020   CO2 16 (L) 07/29/2020   GLUCOSE 129 (H) 07/29/2020   BUN 42 (H) 07/29/2020   CREATININE 3.77 (HH) 07/29/2020   CALCIUM 10.2 07/29/2020   PROT 7.9 07/29/2020   ALBUMIN 2.5 (L) 07/29/2020   AST 8 (L) 07/29/2020   ALT 7 07/29/2020   ALKPHOS 72 07/29/2020   BILITOT 0.2 (L) 07/29/2020   GFRNONAA 16 (L) 07/29/2020   GFRAA 18 (L) 07/29/2020    Lab Results  Component Value Date   CEA1 2.86 07/29/2020     Medications: I have reviewed the patient's current medications.   Assessment/Plan: 1. Metastatic rectal cancer,  perforated rectal tumor with abscess, stage IV ? Presented with "diverticulitis "March 2020, status post drainage catheter ? Colonoscopy 10/17/2019-obstructing mass at 10 cm, biopsy confirmed adenocarcinoma ? MRI pelvis 10/22/2019-T3 versus T4, into, M0 lesion-evaluation limited due to abscess ? Neoadjuvant radiation-short course ? 01/17/2020-palliative low anterior resection, end colostomy, biopsy of peritoneal nodules, liver biopsy, left ureter stent, right ureter reimplantation, placement of pelvic drains  Operative findings included chronic perforation of the rectum with involvement of the pelvic sidewalls and bladder, 2 peritoneal nodules at the sacral promontory, 1 cm mass at segment 4 of the liver  Pathology confirmeda ypT4n, pN1c,M1c,moderately differentiated adenocarcinoma, lymphovascular and perineural invasion present, treatment response score 2, positive distal and radial margins, 0/15 lymph nodes, 1 tumor deposit, no loss of mismatch repair protein expression, MSS, positive for adenocarcinoma involving the right ureter, segment 4 liver, and peritoneal deposits  CTs chest, abdomen, and pelvis 02/23/2020-2 pelvic surgical drains, 2.1 cm hepatic dome hypodensity, 1.8 cm peripheral segment 6/7 lesion, bilateral ureter stents, mild left hydroureteronephrosis, 2 new right lower lobe 0.3 cm nodules-indeterminate  Cycle 1 FOLFOX 03/28/2020  Cycle 2 FOLFOX 04/11/2020  Cycle 3 FOLFOX 04/25/2020  CT 05/21/2020 at [facility]-(compared to CT abdomen/pelvis 02/23/2020 ) mild to moderate right hydroureteronephrosis increased since prior.  Slight distal migration of the right ureteral stent with the proximal tip terminating in the proximal right ureter.  Mild left hydronephrosis.  Similar to prior.  Unchanged position of left ureteral stent.  Stable sites of mesorectal fascial thickening and decreased size of liver lesions since the comparison study.  Cycle 4 FOLFOX 06/17/2020  Cycle 5 FOLFOX  07/01/2020 2.Diverticulitis/perforated rectal tumor-pelvic drains remain in place 3.Coronary artery disease 4.Chronic systolic heart failure 5.History of anLV thrombus, status post course of Eliquis, no thrombus visualized on echocardiogram 09/15/2019, LVEF 45-50 6.Renal ultrasound 03/15/2020-moderate bilateral hydronephrosis with ureteral stents in position bilaterally--bilateral stent exchangeDr. Lottie Rater, [facility]rology at [facility] 03/22/2020 7.   Anemia 8.   Renal failure-improved following ureter stent exchange April 2021  Progressive renal failure 05/08/2020-ultrasound with mild to moderate left hydronephrosis, resolution of right hydronephrosis, and left double-J stent was not appreciated  Stent exchange at [facility] 05/22/2020  Progressive renal failure 07/29/2020-chemotherapy held, [facility] urology contacted for stent exchange    Disposition: Clinton Cordova has completed 5 cycles of FOLFOX.  He was scheduled to receive cycle 6 today, but the creatinine is again significantly elevated.  I will contact the urology service at [facility] to discuss the case.  The creatinine improved after the last ureter stent exchange.  Clinton Cordova will be rescheduled for the next cycle of FOLFOX in 2 weeks.  Betsy Coder, MD  07/29/2020  2:12 PM

## 2020-07-29 NOTE — Progress Notes (Signed)
Per MD Benay Spice, tx cancelled today due to creatinine.  We will follow up with him after MD Sherril talks to his urologist

## 2020-07-30 ENCOUNTER — Other Ambulatory Visit: Payer: Self-pay | Admitting: *Deleted

## 2020-07-30 ENCOUNTER — Other Ambulatory Visit: Payer: Self-pay | Admitting: Oncology

## 2020-07-30 ENCOUNTER — Ambulatory Visit (HOSPITAL_COMMUNITY)
Admission: RE | Admit: 2020-07-30 | Discharge: 2020-07-30 | Disposition: A | Discharge status: Home / Self Care | Payer: Medicare Other | Source: Ambulatory Visit | Attending: Oncology | Admitting: Oncology

## 2020-07-30 ENCOUNTER — Telehealth: Payer: Self-pay | Admitting: *Deleted

## 2020-07-30 DIAGNOSIS — N189 Chronic kidney disease, unspecified: Secondary | ICD-10-CM | POA: Diagnosis present

## 2020-07-30 DIAGNOSIS — C20 Malignant neoplasm of rectum: Secondary | ICD-10-CM

## 2020-07-30 DIAGNOSIS — N179 Acute kidney failure, unspecified: Secondary | ICD-10-CM | POA: Diagnosis present

## 2020-07-30 NOTE — Telephone Encounter (Signed)
Dr. Benay Spice discussed creatinine with Dr. Thurmond Butts this morning. Dr. Thurmond Butts requested a renal US be done today. Patient was called had [place] completed at 12:05 today and report faxed to office [phone].

## 2020-07-31 ENCOUNTER — Telehealth: Payer: Self-pay | Admitting: Oncology

## 2020-07-31 ENCOUNTER — Telehealth: Payer: Self-pay | Admitting: *Deleted

## 2020-07-31 NOTE — Telephone Encounter (Signed)
Called patient and he reports he has not received a call from Dr. Reuel Derby office at [facility]. Called and spoke w/Lori at [facility] and requested message be sent to Dr. Thurmond Butts that renal US is done and results were faxed yesterday and should be able to see in Care Everywhere. Patient is expecting a call.

## 2020-07-31 NOTE — Telephone Encounter (Signed)
Scheduled appointments per 8/30 los. Patient is aware of appointments dates and times.  

## 2020-08-12 ENCOUNTER — Inpatient Hospital Stay: Payer: Medicare Other | Attending: Oncology

## 2020-08-12 ENCOUNTER — Inpatient Hospital Stay: Payer: Medicare Other

## 2020-08-12 ENCOUNTER — Encounter: Payer: Self-pay | Admitting: Nurse Practitioner

## 2020-08-12 ENCOUNTER — Other Ambulatory Visit: Payer: Self-pay

## 2020-08-12 ENCOUNTER — Inpatient Hospital Stay (HOSPITAL_BASED_OUTPATIENT_CLINIC_OR_DEPARTMENT_OTHER): Payer: Medicare Other | Admitting: Nurse Practitioner

## 2020-08-12 VITALS — BP 108/43 | HR 62 | Temp 97.4°F | Resp 20 | Ht 72.0 in | Wt 179.1 lb

## 2020-08-12 DIAGNOSIS — C20 Malignant neoplasm of rectum: Secondary | ICD-10-CM

## 2020-08-12 DIAGNOSIS — Z79899 Other long term (current) drug therapy: Secondary | ICD-10-CM | POA: Insufficient documentation

## 2020-08-12 DIAGNOSIS — Z5111 Encounter for antineoplastic chemotherapy: Secondary | ICD-10-CM | POA: Diagnosis not present

## 2020-08-12 DIAGNOSIS — N19 Unspecified kidney failure: Secondary | ICD-10-CM | POA: Insufficient documentation

## 2020-08-12 DIAGNOSIS — Z95828 Presence of other vascular implants and grafts: Secondary | ICD-10-CM

## 2020-08-12 DIAGNOSIS — D631 Anemia in chronic kidney disease: Secondary | ICD-10-CM | POA: Diagnosis not present

## 2020-08-12 LAB — CBC WITH DIFFERENTIAL (CANCER CENTER ONLY)
Abs Immature Granulocytes: 0.04 10*3/uL (ref 0.00–0.07)
Basophils Absolute: 0.1 10*3/uL (ref 0.0–0.1)
Basophils Relative: 1 %
Eosinophils Absolute: 0.3 10*3/uL (ref 0.0–0.5)
Eosinophils Relative: 3 %
HCT: 24.2 % — ABNORMAL LOW (ref 39.0–52.0)
Hemoglobin: 7 g/dL — ABNORMAL LOW (ref 13.0–17.0)
Immature Granulocytes: 1 %
Lymphocytes Relative: 12 %
Lymphs Abs: 1 10*3/uL (ref 0.7–4.0)
MCH: 25.5 pg — ABNORMAL LOW (ref 26.0–34.0)
MCHC: 28.9 g/dL — ABNORMAL LOW (ref 30.0–36.0)
MCV: 88 fL (ref 80.0–100.0)
Monocytes Absolute: 1.1 10*3/uL — ABNORMAL HIGH (ref 0.1–1.0)
Monocytes Relative: 13 %
Neutro Abs: 6 10*3/uL (ref 1.7–7.7)
Neutrophils Relative %: 70 %
Platelet Count: 416 10*3/uL — ABNORMAL HIGH (ref 150–400)
RBC: 2.75 MIL/uL — ABNORMAL LOW (ref 4.22–5.81)
RDW: 17.7 % — ABNORMAL HIGH (ref 11.5–15.5)
WBC Count: 8.4 10*3/uL (ref 4.0–10.5)
nRBC: 0.4 % — ABNORMAL HIGH (ref 0.0–0.2)

## 2020-08-12 LAB — CMP (CANCER CENTER ONLY)
ALT: 8 U/L (ref 0–44)
AST: 13 U/L — ABNORMAL LOW (ref 15–41)
Albumin: 2.7 g/dL — ABNORMAL LOW (ref 3.5–5.0)
Alkaline Phosphatase: 74 U/L (ref 38–126)
Anion gap: 11 (ref 5–15)
BUN: 37 mg/dL — ABNORMAL HIGH (ref 8–23)
CO2: 18 mmol/L — ABNORMAL LOW (ref 22–32)
Calcium: 9.3 mg/dL (ref 8.9–10.3)
Chloride: 109 mmol/L (ref 98–111)
Creatinine: 2.92 mg/dL — ABNORMAL HIGH (ref 0.61–1.24)
GFR, Est AFR Am: 25 mL/min — ABNORMAL LOW (ref >60)
GFR, Estimated: 21 mL/min — ABNORMAL LOW (ref >60)
Glucose, Bld: 95 mg/dL (ref 70–99)
Potassium: 4.6 mmol/L (ref 3.5–5.1)
Sodium: 138 mmol/L (ref 135–145)
Total Bilirubin: 0.2 mg/dL — ABNORMAL LOW (ref 0.3–1.2)
Total Protein: 7.7 g/dL (ref 6.5–8.1)

## 2020-08-12 LAB — CEA (IN HOUSE-CHCC): CEA (CHCC-In House): 2.71 ng/mL (ref 0.00–5.00)

## 2020-08-12 LAB — PREPARE RBC (CROSSMATCH)

## 2020-08-12 MED ORDER — SODIUM CHLORIDE 0.9% IV SOLUTION
250.0000 mL | Freq: Once | INTRAVENOUS | Status: AC
  Administered 2020-08-12: 250 mL via INTRAVENOUS
  Filled 2020-08-12: qty 250

## 2020-08-12 MED ORDER — SODIUM CHLORIDE 0.9% FLUSH
10.0000 mL | INTRAVENOUS | Status: DC | PRN
  Administered 2020-08-12: 10 mL
  Filled 2020-08-12: qty 10

## 2020-08-12 MED ORDER — SODIUM CHLORIDE 0.9% FLUSH
10.0000 mL | INTRAVENOUS | Status: AC | PRN
  Administered 2020-08-12: 10 mL
  Filled 2020-08-12: qty 10

## 2020-08-12 MED ORDER — HEPARIN SOD (PORK) LOCK FLUSH 100 UNIT/ML IV SOLN
500.0000 [IU] | Freq: Every day | INTRAVENOUS | Status: AC | PRN
  Administered 2020-08-12: 500 [IU]
  Filled 2020-08-12: qty 5

## 2020-08-12 NOTE — Patient Instructions (Signed)

## 2020-08-12 NOTE — Progress Notes (Signed)
[place] OFFICE PROGRESS NOTE   Diagnosis: Colon cancer  INTERVAL HISTORY:   Clinton Cordova returns for follow-up.  Cycle 6 FOLFOX was held on 07/29/2020 due to elevation of the creatinine.  He was referred to urology at [facility].  Reports undergoing bilateral ureteral stent exchange on 08/09/2020.  Pain is better.  He reports being on an antibiotic for urinary tract infection.  No fever.  He denies nausea/vomiting.  No diarrhea.  Cold sensitivity tends to last 3 or 4 days after each treatment.  No persistent neuropathy symptoms.  Energy level is poor.  He feels weak.  He has dyspnea on exertion.  He feels he needs a blood transfusion.  He denies bleeding.  Objective:  Vital signs in last 24 hours:  Blood pressure (!) 108/43, pulse 62, temperature (!) 97.4 F (36.3 C), temperature source Tympanic, resp. rate 20, height 6' (1.829 m), weight 179 lb 1.6 oz (81.2 kg), SpO2 98 %.    HEENT: No thrush or ulcers. Resp: Lungs clear bilaterally. Cardio: Regular rate and rhythm. GI: Abdomen soft and nontender.  No hepatomegaly.  Left lower quadrant colostomy. Vascular: No leg edema.  Skin: Palms without erythema. Port-A-Cath without erythema.   Lab Results:  Lab Results  Component Value Date   WBC 8.4 08/12/2020   HGB 7.0 (L) 08/12/2020   HCT 24.2 (L) 08/12/2020   MCV 88.0 08/12/2020   PLT 416 (H) 08/12/2020   NEUTROABS 6.0 08/12/2020    Imaging:  No results found.  Medications: I have reviewed the patient's current medications.  Assessment/Plan: 1. Metastatic rectal cancer, perforated rectal tumor with abscess, stage IV ? Presented with "diverticulitis "March 2020, status post drainage catheter ? Colonoscopy 10/17/2019-obstructing mass at 10 cm, biopsy confirmed adenocarcinoma ? MRI pelvis 10/22/2019-T3 versus T4, into, M0 lesion-evaluation limited due to abscess ? Neoadjuvant radiation-short course ? 01/17/2020-palliative low anterior resection, end colostomy, biopsy  of peritoneal nodules, liver biopsy, left ureter stent, right ureter reimplantation, placement of pelvic drains  Operative findings included chronic perforation of the rectum with involvement of the pelvic sidewalls and bladder, 2 peritoneal nodules at the sacral promontory, 1 cm mass at segment 4 of the liver  Pathology confirmeda ypT4n, pN1c,M1c,moderately differentiated adenocarcinoma, lymphovascular and perineural invasion present, treatment response score 2, positive distal and radial margins, 0/15 lymph nodes, 1 tumor deposit, no loss of mismatch repair protein expression, MSS, positive for adenocarcinoma involving the right ureter, segment 4 liver, and peritoneal deposits  CTs chest, abdomen, and pelvis 02/23/2020-2 pelvic surgical drains, 2.1 cm hepatic dome hypodensity, 1.8 cm peripheral segment 6/7 lesion, bilateral ureter stents, mild left hydroureteronephrosis, 2 new right lower lobe 0.3 cm nodules-indeterminate  Cycle 1 FOLFOX 03/28/2020  Cycle 2 FOLFOX 04/11/2020  Cycle 3 FOLFOX 04/25/2020  CT 05/21/2020 at [facility]-(compared to CT abdomen/pelvis 02/23/2020 ) mild to moderate right hydroureteronephrosis increased since prior.  Slight distal migration of the right ureteral stent with the proximal tip terminating in the proximal right ureter.  Mild left hydronephrosis.  Similar to prior.  Unchanged position of left ureteral stent.  Stable sites of mesorectal fascial thickening and decreased size of liver lesions since the comparison study.  Cycle 4 FOLFOX 06/17/2020  Cycle 5 FOLFOX 07/01/2020 2.Diverticulitis/perforated rectal tumor-pelvic drains remain in place 3.Coronary artery disease 4.Chronic systolic heart failure 5.History of anLV thrombus, status post course of Eliquis, no thrombus visualized on echocardiogram 09/15/2019, LVEF 45-50 6.Renal ultrasound 03/15/2020-moderate bilateral hydronephrosis with ureteral stents in position bilaterally--bilateral stent  exchangeDr. Lottie Rater, urology at  [facility] 03/22/2020 7. Anemia 8. Renal failure-improved following ureter stent exchange April 2021  Progressive renal failure 05/08/2020-ultrasound with mild to moderate left hydronephrosis, resolution of right hydronephrosis, and left double-J stent was not appreciated  Stent exchange at [facility] 05/22/2020  Progressive renal failure 07/29/2020-chemotherapy held, [facility] urology contacted for stent exchange    Disposition: Clinton Cordova has completed 5 cycles of FOLFOX.  Treatment was held 2 weeks ago due to significant elevation of the creatinine.  He was referred to urology at [facility] and underwent stent exchange 08/09/2020.  We reviewed the creatinine from today, improved but remains elevated.  We are holding today's chemotherapy and rescheduling for 1 week.  We reviewed the CBC.  He has symptomatic anemia.  He will receive 2 units of blood today.  He will return for lab, follow-up, FOLFOX in 1 week.  He will contact the office in the interim with any problems.  Plan reviewed with Dr. Benay Spice.    Ned Card ANP/GNP-BC   08/12/2020  11:05 AM

## 2020-08-12 NOTE — Patient Instructions (Signed)

## 2020-08-13 ENCOUNTER — Telehealth: Payer: Self-pay | Admitting: Oncology

## 2020-08-13 LAB — TYPE AND SCREEN
ABO/RH(D): A NEG
Antibody Screen: NEGATIVE
Unit division: 0
Unit division: 0

## 2020-08-13 LAB — BPAM RBC
Blood Product Expiration Date: 202110032359
Blood Product Expiration Date: 202110072359
ISSUE DATE / TIME: 202109131312
ISSUE DATE / TIME: 202109131312
Unit Type and Rh: 600
Unit Type and Rh: 600

## 2020-08-13 NOTE — Telephone Encounter (Signed)
Scheduled appointments per 9/13 los. Patient is aware of appointment date and time.

## 2020-08-19 ENCOUNTER — Other Ambulatory Visit: Payer: Medicare Other

## 2020-08-19 ENCOUNTER — Other Ambulatory Visit: Payer: Self-pay

## 2020-08-19 ENCOUNTER — Inpatient Hospital Stay (HOSPITAL_BASED_OUTPATIENT_CLINIC_OR_DEPARTMENT_OTHER): Payer: Medicare Other | Admitting: Nurse Practitioner

## 2020-08-19 ENCOUNTER — Inpatient Hospital Stay: Payer: Medicare Other

## 2020-08-19 ENCOUNTER — Encounter: Payer: Self-pay | Admitting: Nurse Practitioner

## 2020-08-19 ENCOUNTER — Ambulatory Visit: Payer: Medicare Other | Admitting: Nurse Practitioner

## 2020-08-19 ENCOUNTER — Ambulatory Visit: Payer: Medicare Other

## 2020-08-19 VITALS — BP 132/69 | HR 66 | Temp 98.0°F | Resp 20 | Ht 72.0 in | Wt 175.9 lb

## 2020-08-19 DIAGNOSIS — C20 Malignant neoplasm of rectum: Secondary | ICD-10-CM | POA: Diagnosis not present

## 2020-08-19 DIAGNOSIS — Z5111 Encounter for antineoplastic chemotherapy: Secondary | ICD-10-CM | POA: Diagnosis not present

## 2020-08-19 DIAGNOSIS — Z95828 Presence of other vascular implants and grafts: Secondary | ICD-10-CM

## 2020-08-19 LAB — CMP (CANCER CENTER ONLY)
ALT: 22 U/L (ref 0–44)
AST: 15 U/L (ref 15–41)
Albumin: 2.8 g/dL — ABNORMAL LOW (ref 3.5–5.0)
Alkaline Phosphatase: 97 U/L (ref 38–126)
Anion gap: 8 (ref 5–15)
BUN: 43 mg/dL — ABNORMAL HIGH (ref 8–23)
CO2: 20 mmol/L — ABNORMAL LOW (ref 22–32)
Calcium: 9.8 mg/dL (ref 8.9–10.3)
Chloride: 107 mmol/L (ref 98–111)
Creatinine: 2.71 mg/dL — ABNORMAL HIGH (ref 0.61–1.24)
GFR, Est AFR Am: 27 mL/min — ABNORMAL LOW (ref >60)
GFR, Estimated: 23 mL/min — ABNORMAL LOW (ref >60)
Glucose, Bld: 89 mg/dL (ref 70–99)
Potassium: 5.2 mmol/L — ABNORMAL HIGH (ref 3.5–5.1)
Sodium: 135 mmol/L (ref 135–145)
Total Bilirubin: 0.2 mg/dL — ABNORMAL LOW (ref 0.3–1.2)
Total Protein: 7.8 g/dL (ref 6.5–8.1)

## 2020-08-19 LAB — CBC WITH DIFFERENTIAL (CANCER CENTER ONLY)
Abs Immature Granulocytes: 0.02 10*3/uL (ref 0.00–0.07)
Basophils Absolute: 0.1 10*3/uL (ref 0.0–0.1)
Basophils Relative: 1 %
Eosinophils Absolute: 0.4 10*3/uL (ref 0.0–0.5)
Eosinophils Relative: 5 %
HCT: 32.5 % — ABNORMAL LOW (ref 39.0–52.0)
Hemoglobin: 9.6 g/dL — ABNORMAL LOW (ref 13.0–17.0)
Immature Granulocytes: 0 %
Lymphocytes Relative: 14 %
Lymphs Abs: 1.1 10*3/uL (ref 0.7–4.0)
MCH: 26.2 pg (ref 26.0–34.0)
MCHC: 29.5 g/dL — ABNORMAL LOW (ref 30.0–36.0)
MCV: 88.6 fL (ref 80.0–100.0)
Monocytes Absolute: 0.9 10*3/uL (ref 0.1–1.0)
Monocytes Relative: 11 %
Neutro Abs: 5.8 10*3/uL (ref 1.7–7.7)
Neutrophils Relative %: 69 %
Platelet Count: 355 10*3/uL (ref 150–400)
RBC: 3.67 MIL/uL — ABNORMAL LOW (ref 4.22–5.81)
RDW: 16.7 % — ABNORMAL HIGH (ref 11.5–15.5)
WBC Count: 8.2 10*3/uL (ref 4.0–10.5)
nRBC: 0 % (ref 0.0–0.2)

## 2020-08-19 MED ORDER — PALONOSETRON HCL INJECTION 0.25 MG/5ML
INTRAVENOUS | Status: AC
  Filled 2020-08-19: qty 5

## 2020-08-19 MED ORDER — OXALIPLATIN CHEMO INJECTION 100 MG/20ML
65.0000 mg/m2 | Freq: Once | INTRAVENOUS | Status: AC
  Administered 2020-08-19: 125 mg via INTRAVENOUS
  Filled 2020-08-19: qty 10

## 2020-08-19 MED ORDER — FLUOROURACIL CHEMO INJECTION 2.5 GM/50ML
400.0000 mg/m2 | Freq: Once | INTRAVENOUS | Status: AC
  Administered 2020-08-19: 800 mg via INTRAVENOUS
  Filled 2020-08-19: qty 16

## 2020-08-19 MED ORDER — PALONOSETRON HCL INJECTION 0.25 MG/5ML
0.2500 mg | Freq: Once | INTRAVENOUS | Status: AC
  Administered 2020-08-19: 0.25 mg via INTRAVENOUS

## 2020-08-19 MED ORDER — DEXTROSE 5 % IV SOLN
Freq: Once | INTRAVENOUS | Status: AC
  Filled 2020-08-19: qty 250

## 2020-08-19 MED ORDER — ALTEPLASE 2 MG IJ SOLR
INTRAMUSCULAR | Status: AC
  Filled 2020-08-19: qty 2

## 2020-08-19 MED ORDER — SODIUM CHLORIDE 0.9% FLUSH
10.0000 mL | INTRAVENOUS | Status: DC | PRN
  Administered 2020-08-19: 10 mL
  Filled 2020-08-19: qty 10

## 2020-08-19 MED ORDER — SODIUM CHLORIDE 0.9 % IV SOLN
2400.0000 mg/m2 | INTRAVENOUS | Status: DC
  Administered 2020-08-19: 4650 mg via INTRAVENOUS
  Filled 2020-08-19: qty 93

## 2020-08-19 MED ORDER — SODIUM CHLORIDE 0.9 % IV SOLN
10.0000 mg | Freq: Once | INTRAVENOUS | Status: AC
  Administered 2020-08-19: 10 mg via INTRAVENOUS
  Filled 2020-08-19: qty 1
  Filled 2020-08-19: qty 10

## 2020-08-19 MED ORDER — LEUCOVORIN CALCIUM INJECTION 350 MG
400.0000 mg/m2 | Freq: Once | INTRAVENOUS | Status: AC
  Administered 2020-08-19: 776 mg via INTRAVENOUS
  Filled 2020-08-19: qty 38.8

## 2020-08-19 MED ORDER — ALTEPLASE 2 MG IJ SOLR
2.0000 mg | Freq: Once | INTRAMUSCULAR | Status: AC | PRN
  Administered 2020-08-19: 2 mg
  Filled 2020-08-19: qty 2

## 2020-08-19 NOTE — Progress Notes (Addendum)
[facility] OFFICE PROGRESS NOTE   Diagnosis: Colon cancer  INTERVAL HISTORY:   Clinton Cordova returns as scheduled.  Cycle 6 of FOLFOX was held last week due to persistent elevation of the creatinine following stent exchange.  He was transfused 2 units of blood.  He is feeling much better.  No nausea or vomiting.  No diarrhea.  He denies pain.  Appetite is better.  No fever.  No numbness or tingling in the hands or feet.  Objective:  Vital signs in last 24 hours:  Blood pressure 132/69, pulse 66, temperature 98 F (36.7 C), temperature source Tympanic, resp. rate 20, height 6' (1.829 m), weight 175 lb 14.4 oz (79.8 kg), SpO2 97 %.    HEENT: No thrush or ulcers. Resp: Lungs clear bilaterally. Cardio: Regular rate and rhythm. GI: No hepatomegaly.  Left lower quadrant colostomy. Vascular: No leg edema. Neuro: Vibratory sense mildly to moderately decreased over the fingertips per tuning fork exam. Skin: Palms without erythema. Port-A-Cath without erythema.   Lab Results:  Lab Results  Component Value Date   WBC 8.2 08/19/2020   HGB 9.6 (L) 08/19/2020   HCT 32.5 (L) 08/19/2020   MCV 88.6 08/19/2020   PLT 355 08/19/2020   NEUTROABS 5.8 08/19/2020    Imaging:  No results found.  Medications: I have reviewed the patient's current medications.  Assessment/Plan: 1. Metastatic rectal cancer, perforated rectal tumor with abscess, stage IV ? Presented with "diverticulitis "March 2020, status post drainage catheter ? Colonoscopy 10/17/2019-obstructing mass at 10 cm, biopsy confirmed adenocarcinoma ? MRI pelvis 10/22/2019-T3 versus T4, into, M0 lesion-evaluation limited due to abscess ? Neoadjuvant radiation-short course ? 01/17/2020-palliative low anterior resection, end colostomy, biopsy of peritoneal nodules, liver biopsy, left ureter stent, right ureter reimplantation, placement of pelvic drains  Operative findings included chronic perforation of the rectum  with involvement of the pelvic sidewalls and bladder, 2 peritoneal nodules at the sacral promontory, 1 cm mass at segment 4 of the liver  Pathology confirmeda ypT4n, pN1c,M1c,moderately differentiated adenocarcinoma, lymphovascular and perineural invasion present, treatment response score 2, positive distal and radial margins, 0/15 lymph nodes, 1 tumor deposit, no loss of mismatch repair protein expression, MSS, positive for adenocarcinoma involving the right ureter, segment 4 liver, and peritoneal deposits  CTs chest, abdomen, and pelvis 02/23/2020-2 pelvic surgical drains, 2.1 cm hepatic dome hypodensity, 1.8 cm peripheral segment 6/7 lesion, bilateral ureter stents, mild left hydroureteronephrosis, 2 new right lower lobe 0.3 cm nodules-indeterminate  Cycle 1 FOLFOX 03/28/2020  Cycle 2 FOLFOX 04/11/2020  Cycle 3 FOLFOX 04/25/2020  CT 05/21/2020 at [facility]-(compared to CT abdomen/pelvis 02/23/2020 ) mild to moderate right hydroureteronephrosis increased since prior. Slight distal migration of the right ureteral stent with the proximal tip terminating in the proximal right ureter. Mild left hydronephrosis. Similar to prior. Unchanged position of left ureteral stent. Stable sites of mesorectal fascial thickening and decreased size of liver lesions since the comparison study.  Cycle 4 FOLFOX 06/17/2020  Cycle 5 FOLFOX 07/01/2020  Cycle 6 FOLFOX 08/19/2020 (oxaliplatin dose reduced due to elevated creatinine) 2.Diverticulitis/perforated rectal tumor-pelvic drains remain in place 3.Coronary artery disease 4.Chronic systolic heart failure 5.History of anLV thrombus, status post course of Eliquis, no thrombus visualized on echocardiogram 09/15/2019, LVEF 45-50 6.Renal ultrasound 03/15/2020-moderate bilateral hydronephrosis with ureteral stents in position bilaterally--bilateral stent exchangeClinton Cordova, urology at [facility] 03/22/2020 7. Anemia 8. Renal failure-improved following  ureter stent exchange April 2021  Progressive renal failure 05/08/2020-ultrasound with mild to moderate left hydronephrosis, resolution of right   hydronephrosis, and left double-J stent was not appreciated  Stent exchange at [facility] 05/22/2020  Progressive renal failure 07/29/2020-chemotherapy held, [facility] urology contacted for stent exchange  Stent exchange August 09, 2020  Disposition: Clinton Cordova appears stable.  He has completed 5 cycles of FOLFOX.  Plan to proceed with cycle 6 today as scheduled.  Oxaliplatin will be dose reduced due to renal failure.  We reviewed the CBC and chemistry panel from today.  Labs adequate to proceed with treatment, oxaliplatin dose reduction as above.  He has an appointment with Clinton Cordova next week.  He will return for lab, follow-up, FOLFOX in 2 weeks.  He will contact the office in the interim with any problems.  Patient seen with Clinton Cordova.    Ned Card ANP/GNP-BC   08/19/2020  11:55 AM This was a shared visit with Ned Card.  The creatinine remains elevated following exchange of the ureter stents.  This may be a new baseline creatinine.  The plan is to proceed with FOLFOX.  Oxaliplatin will be dose reduced.  I will update ClintonMcRee.  The restaging CTs will likely be rescheduled.  Julieanne Manson, MD

## 2020-08-19 NOTE — Patient Instructions (Signed)
[facility] Discharge Instructions for Patients Receiving Chemotherapy  Today you received the following chemotherapy agents: oxaliplatin/leucovorin/fluorouracil.  To help prevent nausea and vomiting after your treatment, we encourage you to take your nausea medication as directed.  If you develop nausea and vomiting that is not controlled by your nausea medication, call the clinic.   BELOW ARE SYMPTOMS THAT SHOULD BE REPORTED IMMEDIATELY:  *FEVER GREATER THAN 100.5 F  *CHILLS WITH OR WITHOUT FEVER  NAUSEA AND VOMITING THAT IS NOT CONTROLLED WITH YOUR NAUSEA MEDICATION  *UNUSUAL SHORTNESS OF BREATH  *UNUSUAL BRUISING OR BLEEDING  TENDERNESS IN MOUTH AND THROAT WITH OR WITHOUT PRESENCE OF ULCERS  *URINARY PROBLEMS  *BOWEL PROBLEMS  UNUSUAL RASH Items with * indicate a potential emergency and should be followed up as soon as possible.  Feel free to call the clinic should you have any questions or concerns. The clinic phone number is [phone].  Please show the CHEMO ALERT CARD at check-in to the Emergency Department and triage nurse.   

## 2020-08-19 NOTE — Patient Instructions (Signed)

## 2020-08-19 NOTE — Progress Notes (Signed)
Unable to get blood return from port.  Cathflo added by RN.  Patient sent back to lab for blood draw.

## 2020-08-20 ENCOUNTER — Telehealth: Payer: Self-pay | Admitting: Nurse Practitioner

## 2020-08-20 NOTE — Telephone Encounter (Signed)
Scheduled appointment per 9/20 los. Called patient, no answer. Left message for patient with appointment date and times.

## 2020-08-21 ENCOUNTER — Inpatient Hospital Stay: Payer: Medicare Other

## 2020-08-21 ENCOUNTER — Other Ambulatory Visit: Payer: Self-pay

## 2020-08-21 VITALS — BP 144/71 | HR 61 | Temp 98.2°F | Resp 18

## 2020-08-21 DIAGNOSIS — Z5111 Encounter for antineoplastic chemotherapy: Secondary | ICD-10-CM | POA: Diagnosis not present

## 2020-08-21 DIAGNOSIS — C20 Malignant neoplasm of rectum: Secondary | ICD-10-CM

## 2020-08-21 MED ORDER — HEPARIN SOD (PORK) LOCK FLUSH 100 UNIT/ML IV SOLN
500.0000 [IU] | Freq: Once | INTRAVENOUS | Status: AC | PRN
  Administered 2020-08-21: 500 [IU]
  Filled 2020-08-21: qty 5

## 2020-08-21 MED ORDER — SODIUM CHLORIDE 0.9% FLUSH
10.0000 mL | INTRAVENOUS | Status: DC | PRN
  Administered 2020-08-21: 10 mL
  Filled 2020-08-21: qty 10

## 2020-08-21 NOTE — Patient Instructions (Signed)

## 2020-09-01 ENCOUNTER — Other Ambulatory Visit: Payer: Self-pay | Admitting: Oncology

## 2020-09-03 ENCOUNTER — Inpatient Hospital Stay: Payer: Medicare Other | Attending: Oncology

## 2020-09-03 ENCOUNTER — Inpatient Hospital Stay (HOSPITAL_BASED_OUTPATIENT_CLINIC_OR_DEPARTMENT_OTHER): Payer: Medicare Other | Admitting: Oncology

## 2020-09-03 ENCOUNTER — Inpatient Hospital Stay: Payer: Medicare Other

## 2020-09-03 ENCOUNTER — Other Ambulatory Visit: Payer: Self-pay | Admitting: *Deleted

## 2020-09-03 ENCOUNTER — Other Ambulatory Visit: Payer: Self-pay

## 2020-09-03 VITALS — BP 117/66 | HR 83 | Temp 97.7°F | Resp 17 | Ht 72.0 in | Wt 175.4 lb

## 2020-09-03 DIAGNOSIS — N19 Unspecified kidney failure: Secondary | ICD-10-CM | POA: Diagnosis not present

## 2020-09-03 DIAGNOSIS — D631 Anemia in chronic kidney disease: Secondary | ICD-10-CM | POA: Insufficient documentation

## 2020-09-03 DIAGNOSIS — Z5111 Encounter for antineoplastic chemotherapy: Secondary | ICD-10-CM | POA: Diagnosis present

## 2020-09-03 DIAGNOSIS — D649 Anemia, unspecified: Secondary | ICD-10-CM

## 2020-09-03 DIAGNOSIS — Z95828 Presence of other vascular implants and grafts: Secondary | ICD-10-CM

## 2020-09-03 DIAGNOSIS — Z79899 Other long term (current) drug therapy: Secondary | ICD-10-CM | POA: Diagnosis not present

## 2020-09-03 DIAGNOSIS — C20 Malignant neoplasm of rectum: Secondary | ICD-10-CM | POA: Insufficient documentation

## 2020-09-03 LAB — CBC WITH DIFFERENTIAL (CANCER CENTER ONLY)
Abs Immature Granulocytes: 0.01 10*3/uL (ref 0.00–0.07)
Basophils Absolute: 0 10*3/uL (ref 0.0–0.1)
Basophils Relative: 1 %
Eosinophils Absolute: 0.2 10*3/uL (ref 0.0–0.5)
Eosinophils Relative: 7 %
HCT: 24.9 % — ABNORMAL LOW (ref 39.0–52.0)
Hemoglobin: 7.6 g/dL — ABNORMAL LOW (ref 13.0–17.0)
Immature Granulocytes: 0 %
Lymphocytes Relative: 15 %
Lymphs Abs: 0.5 10*3/uL — ABNORMAL LOW (ref 0.7–4.0)
MCH: 26.7 pg (ref 26.0–34.0)
MCHC: 30.5 g/dL (ref 30.0–36.0)
MCV: 87.4 fL (ref 80.0–100.0)
Monocytes Absolute: 0.6 10*3/uL (ref 0.1–1.0)
Monocytes Relative: 19 %
Neutro Abs: 1.8 10*3/uL (ref 1.7–7.7)
Neutrophils Relative %: 58 %
Platelet Count: 243 10*3/uL (ref 150–400)
RBC: 2.85 MIL/uL — ABNORMAL LOW (ref 4.22–5.81)
RDW: 20.1 % — ABNORMAL HIGH (ref 11.5–15.5)
WBC Count: 3.1 10*3/uL — ABNORMAL LOW (ref 4.0–10.5)
nRBC: 0 % (ref 0.0–0.2)

## 2020-09-03 LAB — CMP (CANCER CENTER ONLY)
ALT: 7 U/L (ref 0–44)
AST: 12 U/L — ABNORMAL LOW (ref 15–41)
Albumin: 2.8 g/dL — ABNORMAL LOW (ref 3.5–5.0)
Alkaline Phosphatase: 77 U/L (ref 38–126)
Anion gap: 8 (ref 5–15)
BUN: 46 mg/dL — ABNORMAL HIGH (ref 8–23)
CO2: 15 mmol/L — ABNORMAL LOW (ref 22–32)
Calcium: 9.4 mg/dL (ref 8.9–10.3)
Chloride: 113 mmol/L — ABNORMAL HIGH (ref 98–111)
Creatinine: 2.77 mg/dL — ABNORMAL HIGH (ref 0.61–1.24)
GFR, Estimated: 23 mL/min — ABNORMAL LOW (ref >60)
Glucose, Bld: 101 mg/dL — ABNORMAL HIGH (ref 70–99)
Potassium: 4.4 mmol/L (ref 3.5–5.1)
Sodium: 136 mmol/L (ref 135–145)
Total Bilirubin: 0.4 mg/dL (ref 0.3–1.2)
Total Protein: 6.9 g/dL (ref 6.5–8.1)

## 2020-09-03 LAB — PREPARE RBC (CROSSMATCH)

## 2020-09-03 MED ORDER — SODIUM CHLORIDE 0.9% FLUSH
10.0000 mL | INTRAVENOUS | Status: AC | PRN
  Administered 2020-09-03: 10 mL
  Filled 2020-09-03: qty 10

## 2020-09-03 MED ORDER — SODIUM CHLORIDE 0.9% FLUSH
10.0000 mL | INTRAVENOUS | Status: DC | PRN
  Administered 2020-09-03: 10 mL
  Filled 2020-09-03: qty 10

## 2020-09-03 MED ORDER — HEPARIN SOD (PORK) LOCK FLUSH 100 UNIT/ML IV SOLN
500.0000 [IU] | Freq: Every day | INTRAVENOUS | Status: AC | PRN
  Administered 2020-09-03: 500 [IU]
  Filled 2020-09-03: qty 5

## 2020-09-03 MED ORDER — SODIUM CHLORIDE 0.9% IV SOLUTION
250.0000 mL | Freq: Once | INTRAVENOUS | Status: AC
  Administered 2020-09-03: 250 mL via INTRAVENOUS
  Filled 2020-09-03: qty 250

## 2020-09-03 NOTE — Progress Notes (Signed)
Per Dr. Benay Spice: Cancel chemo today and transfuse instead.

## 2020-09-03 NOTE — Patient Instructions (Signed)

## 2020-09-03 NOTE — Progress Notes (Signed)
[place] OFFICE PROGRESS NOTE   Diagnosis: Rectal cancer  INTERVAL HISTORY:   Clinton Cordova completed another cycle of FOLFOX on 08/19/2020. No mouth sores, diarrhea, nausea, or neuropathy symptoms. He reports malaise and exertional dyspnea. He felt better after receiving a red cell transfusion last month. He has mild burning with urination. He is scheduled for a restaging evaluation at [facility] tomorrow. No fever or cough.  Objective:  Vital signs in last 24 hours:  Blood pressure 117/66, pulse 83, temperature 97.7 F (36.5 C), temperature source Tympanic, resp. rate 17, height 6' (1.829 m), weight 175 lb 6.4 oz (79.6 kg), SpO2 95 %.    HEENT: No thrush or ulcers Resp: Lungs clear bilaterally Cardio: Regular rate and rhythm GI: No hepatomegaly, nontender, formed stool in the colostomy bag Vascular: No leg edema    Portacath/PICC-without erythema  Lab Results:  Lab Results  Component Value Date   WBC 3.1 (L) 09/03/2020   HGB 7.6 (L) 09/03/2020   HCT 24.9 (L) 09/03/2020   MCV 87.4 09/03/2020   PLT 243 09/03/2020   NEUTROABS 1.8 09/03/2020    CMP  Lab Results  Component Value Date   NA 136 09/03/2020   K 4.4 09/03/2020   CL 113 (H) 09/03/2020   CO2 15 (L) 09/03/2020   GLUCOSE 101 (H) 09/03/2020   BUN 46 (H) 09/03/2020   CREATININE 2.77 (H) 09/03/2020   CALCIUM 9.4 09/03/2020   PROT 6.9 09/03/2020   ALBUMIN 2.8 (L) 09/03/2020   AST 12 (L) 09/03/2020   ALT 7 09/03/2020   ALKPHOS 77 09/03/2020   BILITOT 0.4 09/03/2020   GFRNONAA 23 (L) 09/03/2020   GFRAA 27 (L) 08/19/2020    Lab Results  Component Value Date   CEA1 2.71 08/12/2020  Assessment/plan: 1. Metastatic rectal cancer, perforated rectal tumor with abscess, stage IV ? Presented with "diverticulitis "March 2020, status post drainage catheter ? Colonoscopy 10/17/2019-obstructing mass at 10 cm, biopsy confirmed adenocarcinoma ? MRI pelvis 10/22/2019-T3 versus T4, into, M0 lesion-evaluation  limited due to abscess ? Neoadjuvant radiation-short course ? 01/17/2020-palliative low anterior resection, end colostomy, biopsy of peritoneal nodules, liver biopsy, left ureter stent, right ureter reimplantation, placement of pelvic drains  Operative findings included chronic perforation of the rectum with involvement of the pelvic sidewalls and bladder, 2 peritoneal nodules at the sacral promontory, 1 cm mass at segment 4 of the liver  Pathology confirmeda ypT4n, pN1c,M1c,moderately differentiated adenocarcinoma, lymphovascular and perineural invasion present, treatment response score 2, positive distal and radial margins, 0/15 lymph nodes, 1 tumor deposit, no loss of mismatch repair protein expression, MSS, positive for adenocarcinoma involving the right ureter, segment 4 liver, and peritoneal deposits  CTs chest, abdomen, and pelvis 02/23/2020-2 pelvic surgical drains, 2.1 cm hepatic dome hypodensity, 1.8 cm peripheral segment 6/7 lesion, bilateral ureter stents, mild left hydroureteronephrosis, 2 new right lower lobe 0.3 cm nodules-indeterminate  Cycle 1 FOLFOX 03/28/2020  Cycle 2 FOLFOX 04/11/2020  Cycle 3 FOLFOX 04/25/2020  CT 05/21/2020 at [facility]-(compared to CT abdomen/pelvis 02/23/2020 ) mild to moderate right hydroureteronephrosis increased since prior. Slight distal migration of the right ureteral stent with the proximal tip terminating in the proximal right ureter. Mild left hydronephrosis. Similar to prior. Unchanged position of left ureteral stent. Stable sites of mesorectal fascial thickening and decreased size of liver lesions since the comparison study.  Cycle 4 FOLFOX 06/17/2020  Cycle 5 FOLFOX 07/01/2020  Cycle 6 FOLFOX 08/19/2020 (oxaliplatin dose reduced due to elevated creatinine) 2.Diverticulitis/perforated rectal tumor-pelvic drains remain  in place 3.Coronary artery disease 4.Chronic systolic heart failure 5.History of anLV thrombus, status post course of  Eliquis, no thrombus visualized on echocardiogram 09/15/2019, LVEF 45-50 6.Renal ultrasound 03/15/2020-moderate bilateral hydronephrosis with ureteral stents in position bilaterally--bilateral stent exchangeDr. Lottie Rater, urology at [facility] 03/22/2020 7. Anemia secondary to chemotherapy, chronic disease, and renal failure 8. Renal failure-improved following ureter stent exchange April 2021  Progressive renal failure 05/08/2020-ultrasound with mild to moderate left hydronephrosis, resolution of right hydronephrosis, and left double-J stent was not appreciated  Stent exchange at [facility] 05/22/2020  Progressive renal failure 07/29/2020-chemotherapy held, [facility] urology contacted for stent exchange  Stent exchange August 09, 2020  Medications: I have reviewed the patient's current medications.   Disposition: [name] has metastatic rectal cancer. He is completed a total of 6 cycles of FOLFOX chemotherapy. He presents today with symptomatic anemia. We will arrange for a red cell transfusion today. Chemotherapy will be held today.  He will go to [facility] tomorrow for a restaging evaluation. He continues follow-up at [facility] for management of the hydronephrosis and ureter stents.  [name] will be scheduled for an office visit and FOLFOX next week.  He understands that he will not be able to receive IV contrast secondary to renal failure. I will communicate with Dr. Aleatha Borer.  Mr. Brule will contact us if the dyspnea is not improve following the red cell transfusion.   Betsy Coder, MD  09/03/2020  10:57 AM

## 2020-09-04 ENCOUNTER — Telehealth: Payer: Self-pay | Admitting: Oncology

## 2020-09-04 LAB — TYPE AND SCREEN
ABO/RH(D): A NEG
Antibody Screen: NEGATIVE
Unit division: 0
Unit division: 0

## 2020-09-04 LAB — BPAM RBC
Blood Product Expiration Date: 202110142359
Blood Product Expiration Date: 202110142359
ISSUE DATE / TIME: 202110051254
ISSUE DATE / TIME: 202110051500
Unit Type and Rh: 600
Unit Type and Rh: 600

## 2020-09-04 NOTE — Telephone Encounter (Signed)
Scheduled appointments per 10/5 los. Spoke to patient who is aware of appointments dates and times.

## 2020-09-10 ENCOUNTER — Other Ambulatory Visit: Payer: Self-pay

## 2020-09-10 ENCOUNTER — Inpatient Hospital Stay: Payer: Medicare Other

## 2020-09-10 ENCOUNTER — Inpatient Hospital Stay (HOSPITAL_BASED_OUTPATIENT_CLINIC_OR_DEPARTMENT_OTHER): Payer: Medicare Other | Admitting: Oncology

## 2020-09-10 VITALS — BP 127/81 | HR 63 | Temp 97.3°F | Resp 17 | Ht 72.0 in | Wt 180.6 lb

## 2020-09-10 DIAGNOSIS — C20 Malignant neoplasm of rectum: Secondary | ICD-10-CM | POA: Diagnosis not present

## 2020-09-10 DIAGNOSIS — Z5111 Encounter for antineoplastic chemotherapy: Secondary | ICD-10-CM | POA: Diagnosis not present

## 2020-09-10 LAB — CMP (CANCER CENTER ONLY)
ALT: 13 U/L (ref 0–44)
AST: 16 U/L (ref 15–41)
Albumin: 2.9 g/dL — ABNORMAL LOW (ref 3.5–5.0)
Alkaline Phosphatase: 91 U/L (ref 38–126)
Anion gap: 6 (ref 5–15)
BUN: 30 mg/dL — ABNORMAL HIGH (ref 8–23)
CO2: 20 mmol/L — ABNORMAL LOW (ref 22–32)
Calcium: 9.1 mg/dL (ref 8.9–10.3)
Chloride: 112 mmol/L — ABNORMAL HIGH (ref 98–111)
Creatinine: 2.26 mg/dL — ABNORMAL HIGH (ref 0.61–1.24)
GFR, Estimated: 29 mL/min — ABNORMAL LOW (ref >60)
Glucose, Bld: 83 mg/dL (ref 70–99)
Potassium: 4.5 mmol/L (ref 3.5–5.1)
Sodium: 138 mmol/L (ref 135–145)
Total Bilirubin: 0.2 mg/dL — ABNORMAL LOW (ref 0.3–1.2)
Total Protein: 6.9 g/dL (ref 6.5–8.1)

## 2020-09-10 LAB — CBC WITH DIFFERENTIAL (CANCER CENTER ONLY)
Abs Immature Granulocytes: 0.05 10*3/uL (ref 0.00–0.07)
Basophils Absolute: 0 10*3/uL (ref 0.0–0.1)
Basophils Relative: 1 %
Eosinophils Absolute: 0.2 10*3/uL (ref 0.0–0.5)
Eosinophils Relative: 4 %
HCT: 30.2 % — ABNORMAL LOW (ref 39.0–52.0)
Hemoglobin: 9.2 g/dL — ABNORMAL LOW (ref 13.0–17.0)
Immature Granulocytes: 1 %
Lymphocytes Relative: 16 %
Lymphs Abs: 0.9 10*3/uL (ref 0.7–4.0)
MCH: 28.3 pg (ref 26.0–34.0)
MCHC: 30.5 g/dL (ref 30.0–36.0)
MCV: 92.9 fL (ref 80.0–100.0)
Monocytes Absolute: 1.1 10*3/uL — ABNORMAL HIGH (ref 0.1–1.0)
Monocytes Relative: 18 %
Neutro Abs: 3.6 10*3/uL (ref 1.7–7.7)
Neutrophils Relative %: 60 %
Platelet Count: 278 10*3/uL (ref 150–400)
RBC: 3.25 MIL/uL — ABNORMAL LOW (ref 4.22–5.81)
RDW: 19 % — ABNORMAL HIGH (ref 11.5–15.5)
WBC Count: 5.9 10*3/uL (ref 4.0–10.5)
nRBC: 0 % (ref 0.0–0.2)

## 2020-09-10 MED ORDER — LEUCOVORIN CALCIUM INJECTION 350 MG
400.0000 mg/m2 | Freq: Once | INTRAVENOUS | Status: AC
  Administered 2020-09-10: 776 mg via INTRAVENOUS
  Filled 2020-09-10: qty 38.8

## 2020-09-10 MED ORDER — SODIUM CHLORIDE 0.9 % IV SOLN
2400.0000 mg/m2 | INTRAVENOUS | Status: DC
  Administered 2020-09-10: 4650 mg via INTRAVENOUS
  Filled 2020-09-10: qty 93

## 2020-09-10 MED ORDER — FLUOROURACIL CHEMO INJECTION 2.5 GM/50ML
400.0000 mg/m2 | Freq: Once | INTRAVENOUS | Status: AC
  Administered 2020-09-10: 800 mg via INTRAVENOUS
  Filled 2020-09-10: qty 16

## 2020-09-10 MED ORDER — PALONOSETRON HCL INJECTION 0.25 MG/5ML
INTRAVENOUS | Status: AC
  Filled 2020-09-10: qty 5

## 2020-09-10 MED ORDER — SODIUM CHLORIDE 0.9 % IV SOLN
10.0000 mg | Freq: Once | INTRAVENOUS | Status: AC
  Administered 2020-09-10: 10 mg via INTRAVENOUS
  Filled 2020-09-10: qty 1
  Filled 2020-09-10: qty 10

## 2020-09-10 MED ORDER — OXALIPLATIN CHEMO INJECTION 100 MG/20ML
65.0000 mg/m2 | Freq: Once | INTRAVENOUS | Status: AC
  Administered 2020-09-10: 125 mg via INTRAVENOUS
  Filled 2020-09-10: qty 10

## 2020-09-10 MED ORDER — PALONOSETRON HCL INJECTION 0.25 MG/5ML
0.2500 mg | Freq: Once | INTRAVENOUS | Status: AC
  Administered 2020-09-10: 0.25 mg via INTRAVENOUS

## 2020-09-10 MED ORDER — DEXTROSE 5 % IV SOLN
Freq: Once | INTRAVENOUS | Status: AC
  Filled 2020-09-10: qty 250

## 2020-09-10 NOTE — Progress Notes (Signed)
[place] OFFICE PROGRESS NOTE   Diagnosis: Rectal cancer  INTERVAL HISTORY:   Mr. Elkhatib completed another cycle of FOLFOX on 08/19/2020.  He reports tolerated the chemotherapy well.  No nausea/vomiting, mouth sores, diarrhea, or neuropathy symptoms.  He felt better after the red cell transfusion on 09/03/2020.  He saw Dr. Aleatha Borer last week.  She recommends continuing FOLFOX.  He is scheduled for a restaging PET scan at [facility] on 10/30/2020.  Objective:  Vital signs in last 24 hours:  Blood pressure 127/81, pulse 63, temperature (!) 97.3 F (36.3 C), temperature source Tympanic, resp. rate 17, height 6' (1.829 m), weight 180 lb 9.6 oz (81.9 kg), SpO2 99 %.    HEENT: No thrush or ulcers Resp: Lungs clear bilaterally Cardio: Regular rate and rhythm GI: No hepatosplenomegaly, left lower quadrant colostomy Vascular: No leg edema Neuro: The vibratory sense is intact at the fingertips bilaterally Skin: Palms without erythema  Portacath/PICC-without erythema  Lab Results:  Lab Results  Component Value Date   WBC 5.9 09/10/2020   HGB 9.2 (L) 09/10/2020   HCT 30.2 (L) 09/10/2020   MCV 92.9 09/10/2020   PLT 278 09/10/2020   NEUTROABS 3.6 09/10/2020    CMP  Lab Results  Component Value Date   NA 138 09/10/2020   K 4.5 09/10/2020   CL 112 (H) 09/10/2020   CO2 20 (L) 09/10/2020   GLUCOSE 83 09/10/2020   BUN 30 (H) 09/10/2020   CREATININE 2.26 (H) 09/10/2020   CALCIUM 9.1 09/10/2020   PROT 6.9 09/10/2020   ALBUMIN 2.9 (L) 09/10/2020   AST 16 09/10/2020   ALT 13 09/10/2020   ALKPHOS 91 09/10/2020   BILITOT 0.2 (L) 09/10/2020   GFRNONAA 29 (L) 09/10/2020   GFRAA 27 (L) 08/19/2020    Lab Results  Component Value Date   CEA1 2.71 08/12/2020     Medications: I have reviewed the patient's current medications.   Assessment/Plan: 1. Metastatic rectal cancer, perforated rectal tumor with abscess, stage IV ? Presented with "diverticulitis "March 2020,  status post drainage catheter ? Colonoscopy 10/17/2019-obstructing mass at 10 cm, biopsy confirmed adenocarcinoma ? MRI pelvis 10/22/2019-T3 versus T4, into, M0 lesion-evaluation limited due to abscess ? Neoadjuvant radiation-short course ? 01/17/2020-palliative low anterior resection, end colostomy, biopsy of peritoneal nodules, liver biopsy, left ureter stent, right ureter reimplantation, placement of pelvic drains  Operative findings included chronic perforation of the rectum with involvement of the pelvic sidewalls and bladder, 2 peritoneal nodules at the sacral promontory, 1 cm mass at segment 4 of the liver  Pathology confirmeda ypT4n, pN1c,M1c,moderately differentiated adenocarcinoma, lymphovascular and perineural invasion present, treatment response score 2, positive distal and radial margins, 0/15 lymph nodes, 1 tumor deposit, no loss of mismatch repair protein expression, MSS, positive for adenocarcinoma involving the right ureter, segment 4 liver, and peritoneal deposits  CTs chest, abdomen, and pelvis 02/23/2020-2 pelvic surgical drains, 2.1 cm hepatic dome hypodensity, 1.8 cm peripheral segment 6/7 lesion, bilateral ureter stents, mild left hydroureteronephrosis, 2 new right lower lobe 0.3 cm nodules-indeterminate  Cycle 1 FOLFOX 03/28/2020  Cycle 2 FOLFOX 04/11/2020  Cycle 3 FOLFOX 04/25/2020  CT 05/21/2020 at [facility]-(compared to CT abdomen/pelvis 02/23/2020 ) mild to moderate right hydroureteronephrosis increased since prior. Slight distal migration of the right ureteral stent with the proximal tip terminating in the proximal right ureter. Mild left hydronephrosis. Similar to prior. Unchanged position of left ureteral stent. Stable sites of mesorectal fascial thickening and decreased size of liver lesions since the comparison  study.  Cycle 4 FOLFOX 06/17/2020  Cycle 5 FOLFOX 07/01/2020  Cycle 6 FOLFOX 08/19/2020 (oxaliplatin dose reduced due to elevated creatinine)  CTs at [facility]  09/04/2020-resolution of previously noted pulmonary nodules, no new nodules, new hypodense hepatic dome lesion concerning for worsening hepatic metastatic disease, mild right greater than left hydronephrosis-improved, bilateral ureteral stents  Cycle 7 FOLFOX 09/10/2020 2.Diverticulitis/perforated rectal tumor-pelvic drains remain in place 3.Coronary artery disease 4.Chronic systolic heart failure 5.History of anLV thrombus, status post course of Eliquis, no thrombus visualized on echocardiogram 09/15/2019, LVEF 45-50 6.Renal ultrasound 03/15/2020-moderate bilateral hydronephrosis with ureteral stents in position bilaterally--bilateral stent exchangeDr. Lottie Rater, urology at [facility] 03/22/2020 7. Anemia secondary to chemotherapy, chronic disease, and renal failure 8. Renal failure-improved following ureter stent exchange April 2021  Progressive renal failure 05/08/2020-ultrasound with mild to moderate left hydronephrosis, resolution of right hydronephrosis, and left double-J stent was not appreciated  Stent exchange at [facility] 05/22/2020  Progressive renal failure 07/29/2020-chemotherapy held, [facility] urology contacted for stent exchange  Stent exchange August 09, 2020    Disposition: Mr. Zaring appears stable.  The hemoglobin improved after the red cell transfusion on 09/03/2020.  The creatinine is improved today.  He will complete another cycle of FOLFOX today.  There was no clear evidence of disease progression on restaging CTs at [facility] last week.  Dr. Sigurd Sos recommends continuing FOLFOX.  He is scheduled for a restaging PET scan at [facility] on 10/30/2020.  Mr. Koskela will complete another cycle of FOLFOX today.  He will return for an office visit and chemotherapy in 2 weeks.    Betsy Coder, MD  09/10/2020  2:41 PM

## 2020-09-10 NOTE — Patient Instructions (Signed)
[facility] Discharge Instructions for Patients Receiving Chemotherapy  Today you received the following chemotherapy agents: oxaliplatin/leucovorin/fluorouracil.  To help prevent nausea and vomiting after your treatment, we encourage you to take your nausea medication as directed.  If you develop nausea and vomiting that is not controlled by your nausea medication, call the clinic.   BELOW ARE SYMPTOMS THAT SHOULD BE REPORTED IMMEDIATELY:  *FEVER GREATER THAN 100.5 F  *CHILLS WITH OR WITHOUT FEVER  NAUSEA AND VOMITING THAT IS NOT CONTROLLED WITH YOUR NAUSEA MEDICATION  *UNUSUAL SHORTNESS OF BREATH  *UNUSUAL BRUISING OR BLEEDING  TENDERNESS IN MOUTH AND THROAT WITH OR WITHOUT PRESENCE OF ULCERS  *URINARY PROBLEMS  *BOWEL PROBLEMS  UNUSUAL RASH Items with * indicate a potential emergency and should be followed up as soon as possible.  Feel free to call the clinic should you have any questions or concerns. The clinic phone number is [phone].  Please show the CHEMO ALERT CARD at check-in to the Emergency Department and triage nurse.   

## 2020-09-10 NOTE — Progress Notes (Signed)
Okay to treat with elevated creat per Dr. Benay Spice

## 2020-09-12 ENCOUNTER — Telehealth: Payer: Self-pay | Admitting: Nurse Practitioner

## 2020-09-12 ENCOUNTER — Inpatient Hospital Stay: Payer: Medicare Other

## 2020-09-12 ENCOUNTER — Other Ambulatory Visit: Payer: Self-pay

## 2020-09-12 VITALS — BP 129/63 | HR 58 | Temp 98.5°F | Resp 18

## 2020-09-12 DIAGNOSIS — Z5111 Encounter for antineoplastic chemotherapy: Secondary | ICD-10-CM | POA: Diagnosis not present

## 2020-09-12 DIAGNOSIS — C20 Malignant neoplasm of rectum: Secondary | ICD-10-CM

## 2020-09-12 MED ORDER — SODIUM CHLORIDE 0.9% FLUSH
10.0000 mL | INTRAVENOUS | Status: DC | PRN
  Administered 2020-09-12: 10 mL
  Filled 2020-09-12: qty 10

## 2020-09-12 MED ORDER — HEPARIN SOD (PORK) LOCK FLUSH 100 UNIT/ML IV SOLN
500.0000 [IU] | Freq: Once | INTRAVENOUS | Status: AC | PRN
  Administered 2020-09-12: 500 [IU]
  Filled 2020-09-12: qty 5

## 2020-09-12 NOTE — Telephone Encounter (Signed)
Scheduled appointments per 10/12 los. Spoke to patient's wife who is aware of appointments dates and times.

## 2020-09-22 ENCOUNTER — Other Ambulatory Visit: Payer: Self-pay | Admitting: Oncology

## 2020-09-25 ENCOUNTER — Ambulatory Visit: Payer: Medicare Other | Admitting: Nurse Practitioner

## 2020-09-25 ENCOUNTER — Other Ambulatory Visit: Payer: Medicare Other

## 2020-09-26 ENCOUNTER — Inpatient Hospital Stay: Payer: Medicare Other

## 2020-09-26 ENCOUNTER — Encounter: Payer: Self-pay | Admitting: Nurse Practitioner

## 2020-09-26 ENCOUNTER — Inpatient Hospital Stay (HOSPITAL_BASED_OUTPATIENT_CLINIC_OR_DEPARTMENT_OTHER): Payer: Medicare Other | Admitting: Nurse Practitioner

## 2020-09-26 ENCOUNTER — Other Ambulatory Visit: Payer: Self-pay

## 2020-09-26 VITALS — BP 132/67 | HR 68 | Temp 99.2°F | Resp 17 | Ht 72.0 in | Wt 184.0 lb

## 2020-09-26 DIAGNOSIS — C20 Malignant neoplasm of rectum: Secondary | ICD-10-CM

## 2020-09-26 DIAGNOSIS — Z95828 Presence of other vascular implants and grafts: Secondary | ICD-10-CM

## 2020-09-26 DIAGNOSIS — Z5111 Encounter for antineoplastic chemotherapy: Secondary | ICD-10-CM | POA: Diagnosis not present

## 2020-09-26 LAB — CBC WITH DIFFERENTIAL (CANCER CENTER ONLY)
Abs Immature Granulocytes: 0.01 10*3/uL (ref 0.00–0.07)
Basophils Absolute: 0 10*3/uL (ref 0.0–0.1)
Basophils Relative: 0 %
Eosinophils Absolute: 0.1 10*3/uL (ref 0.0–0.5)
Eosinophils Relative: 5 %
HCT: 25.8 % — ABNORMAL LOW (ref 39.0–52.0)
Hemoglobin: 7.9 g/dL — ABNORMAL LOW (ref 13.0–17.0)
Immature Granulocytes: 0 %
Lymphocytes Relative: 14 %
Lymphs Abs: 0.3 10*3/uL — ABNORMAL LOW (ref 0.7–4.0)
MCH: 28.4 pg (ref 26.0–34.0)
MCHC: 30.6 g/dL (ref 30.0–36.0)
MCV: 92.8 fL (ref 80.0–100.0)
Monocytes Absolute: 0.7 10*3/uL (ref 0.1–1.0)
Monocytes Relative: 28 %
Neutro Abs: 1.3 10*3/uL — ABNORMAL LOW (ref 1.7–7.7)
Neutrophils Relative %: 53 %
Platelet Count: 200 10*3/uL (ref 150–400)
RBC: 2.78 MIL/uL — ABNORMAL LOW (ref 4.22–5.81)
RDW: 17.9 % — ABNORMAL HIGH (ref 11.5–15.5)
WBC Count: 2.5 10*3/uL — ABNORMAL LOW (ref 4.0–10.5)
nRBC: 0 % (ref 0.0–0.2)

## 2020-09-26 LAB — CMP (CANCER CENTER ONLY)
ALT: 7 U/L (ref 0–44)
AST: 10 U/L — ABNORMAL LOW (ref 15–41)
Albumin: 2.6 g/dL — ABNORMAL LOW (ref 3.5–5.0)
Alkaline Phosphatase: 73 U/L (ref 38–126)
Anion gap: 9 (ref 5–15)
BUN: 37 mg/dL — ABNORMAL HIGH (ref 8–23)
CO2: 16 mmol/L — ABNORMAL LOW (ref 22–32)
Calcium: 8.6 mg/dL — ABNORMAL LOW (ref 8.9–10.3)
Chloride: 112 mmol/L — ABNORMAL HIGH (ref 98–111)
Creatinine: 3.31 mg/dL (ref 0.61–1.24)
GFR, Estimated: 20 mL/min — ABNORMAL LOW (ref >60)
Glucose, Bld: 105 mg/dL — ABNORMAL HIGH (ref 70–99)
Potassium: 4.1 mmol/L (ref 3.5–5.1)
Sodium: 137 mmol/L (ref 135–145)
Total Bilirubin: 0.3 mg/dL (ref 0.3–1.2)
Total Protein: 6.1 g/dL — ABNORMAL LOW (ref 6.5–8.1)

## 2020-09-26 LAB — SAMPLE TO BLOOD BANK

## 2020-09-26 LAB — PREPARE RBC (CROSSMATCH)

## 2020-09-26 MED ORDER — SODIUM CHLORIDE 0.9% FLUSH
10.0000 mL | INTRAVENOUS | Status: AC | PRN
  Administered 2020-09-26: 10 mL
  Filled 2020-09-26: qty 10

## 2020-09-26 MED ORDER — SODIUM CHLORIDE 0.9% FLUSH
10.0000 mL | INTRAVENOUS | Status: DC | PRN
  Administered 2020-09-26: 10 mL
  Filled 2020-09-26: qty 10

## 2020-09-26 MED ORDER — SODIUM CHLORIDE 0.9% IV SOLUTION
250.0000 mL | Freq: Once | INTRAVENOUS | Status: DC
  Filled 2020-09-26: qty 250

## 2020-09-26 MED ORDER — HEPARIN SOD (PORK) LOCK FLUSH 100 UNIT/ML IV SOLN
500.0000 [IU] | Freq: Every day | INTRAVENOUS | Status: AC | PRN
  Administered 2020-09-26: 500 [IU]
  Filled 2020-09-26: qty 5

## 2020-09-26 NOTE — Progress Notes (Signed)
[place] OFFICE PROGRESS NOTE   Diagnosis: Rectal cancer  INTERVAL HISTORY:   Mr. Dubey returns as scheduled. He completed cycle 7 FOLFOX 09/10/2020. He denies nausea/vomiting. No mouth sores. No diarrhea. Mild intermittent neuropathy symptoms in the feet. Symptoms do not interfere with activity. He is more fatigued. He has dyspnea on exertion.  Objective:  Vital signs in last 24 hours:  Blood pressure 132/67, pulse 68, temperature 99.2 F (37.3 C), temperature source Tympanic, resp. rate 17, height 6' (1.829 m), weight 184 lb (83.5 kg), SpO2 98 %.    HEENT: No thrush or ulcers. Resp: Lungs clear bilaterally. Cardio: Regular rate and rhythm. GI: Abdomen soft and nontender. No hepatomegaly. Left lower quadrant colostomy. Vascular: No leg edema. Neuro: Vibratory sense intact over the fingertips per tuning fork exam. Port-A-Cath without erythema.  Lab Results:  Lab Results  Component Value Date   WBC 2.5 (L) 09/26/2020   HGB 7.9 (L) 09/26/2020   HCT 25.8 (L) 09/26/2020   MCV 92.8 09/26/2020   PLT 200 09/26/2020   NEUTROABS 1.3 (L) 09/26/2020    Imaging:  No results found.  Medications: I have reviewed the patient's current medications.  Assessment/Plan: 1. Metastatic rectal cancer, perforated rectal tumor with abscess, stage IV ? Presented with "diverticulitis "March 2020, status post drainage catheter ? Colonoscopy 10/17/2019-obstructing mass at 10 cm, biopsy confirmed adenocarcinoma ? MRI pelvis 10/22/2019-T3 versus T4, into, M0 lesion-evaluation limited due to abscess ? Neoadjuvant radiation-short course ? 01/17/2020-palliative low anterior resection, end colostomy, biopsy of peritoneal nodules, liver biopsy, left ureter stent, right ureter reimplantation, placement of pelvic drains  Operative findings included chronic perforation of the rectum with involvement of the pelvic sidewalls and bladder, 2 peritoneal nodules at the sacral promontory, 1  cm mass at segment 4 of the liver  Pathology confirmeda ypT4n, pN1c,M1c,moderately differentiated adenocarcinoma, lymphovascular and perineural invasion present, treatment response score 2, positive distal and radial margins, 0/15 lymph nodes, 1 tumor deposit, no loss of mismatch repair protein expression, MSS, positive for adenocarcinoma involving the right ureter, segment 4 liver, and peritoneal deposits  CTs chest, abdomen, and pelvis 02/23/2020-2 pelvic surgical drains, 2.1 cm hepatic dome hypodensity, 1.8 cm peripheral segment 6/7 lesion, bilateral ureter stents, mild left hydroureteronephrosis, 2 new right lower lobe 0.3 cm nodules-indeterminate  Cycle 1 FOLFOX 03/28/2020  Cycle 2 FOLFOX 04/11/2020  Cycle 3 FOLFOX 04/25/2020  CT 05/21/2020 at [facility]-(compared to CT abdomen/pelvis 02/23/2020 ) mild to moderate right hydroureteronephrosis increased since prior. Slight distal migration of the right ureteral stent with the proximal tip terminating in the proximal right ureter. Mild left hydronephrosis. Similar to prior. Unchanged position of left ureteral stent. Stable sites of mesorectal fascial thickening and decreased size of liver lesions since the comparison study.  Cycle 4 FOLFOX 06/17/2020  Cycle 5 FOLFOX 07/01/2020  Cycle 6 FOLFOX 08/19/2020 (oxaliplatin dose reduced due to elevated creatinine)  CTs at [facility] 09/04/2020-resolution of previously noted pulmonary nodules, no new nodules, new hypodense hepatic dome lesion concerning for worsening hepatic metastatic disease, mild right greater than left hydronephrosis-improved, bilateral ureteral stents  Cycle 7 FOLFOX 09/10/2020 2.Diverticulitis/perforated rectal tumor-pelvic drains remain in place 3.Coronary artery disease 4.Chronic systolic heart failure 5.History of anLV thrombus, status post course of Eliquis, no thrombus visualized on echocardiogram 09/15/2019, LVEF 45-50 6.Renal ultrasound 03/15/2020-moderate  bilateral hydronephrosis with ureteral stents in position bilaterally--bilateral stent exchangeDr. Lottie Rater, [facility]rology at [facility] 03/22/2020 7. Anemia secondary to chemotherapy, chronic disease, and renal failure 8. Renal failure-improved following ureter stent exchange April  2021  Progressive renal failure 05/08/2020-ultrasound with mild to moderate left hydronephrosis, resolution of right hydronephrosis, and left double-J stent was not appreciated  Stent exchange at [facility] 05/22/2020  Progressive renal failure 07/29/2020-chemotherapy held, [facility] urology contacted for stent exchange  Stent exchange August 09, 2020    Disposition: Mr. Sitter appears stable. He has completed 7 cycles of FOLFOX. He has progressive symptomatic anemia, mild neutropenia and further increased creatinine. We are holding today's treatment. He will be transfused blood.  He only wants 1 unit of blood.  Neutropenic precautions reviewed. He understands to contact the office with fever, chills, other signs of infection. He will return for lab, follow-up, possible FOLFOX in 1 week. If the creatinine is higher in 1 week he will be referred back to urology at [facility].    Ned Card ANP/GNP-BC   09/26/2020  12:26 PM

## 2020-09-26 NOTE — Patient Instructions (Signed)

## 2020-09-26 NOTE — Progress Notes (Signed)
CRITICAL VALUE STICKER  CRITICAL VALUE: creatinine 3.31  RECEIVER (on-site recipient of call):Katieann Hungate,RN  DATE & TIME NOTIFIED: 09/26/20 @ 1218  MESSENGER (representative from lab): Rosann Auerbach  MD NOTIFIED: Ned Card, NP  TIME OF NOTIFICATION:1220  RESPONSE: Being seen in office by provider today

## 2020-09-26 NOTE — Patient Instructions (Signed)

## 2020-09-27 ENCOUNTER — Telehealth: Payer: Self-pay | Admitting: *Deleted

## 2020-09-27 DIAGNOSIS — D649 Anemia, unspecified: Secondary | ICD-10-CM

## 2020-09-27 DIAGNOSIS — C20 Malignant neoplasm of rectum: Secondary | ICD-10-CM

## 2020-09-27 NOTE — Telephone Encounter (Signed)
Had called infusion room charge nurse asking to get his 2nd unit of blood on Saturday. He was informed they are at full capacity that day. This RN called him and informed we will add a blood bank sample to his collection on 10/02/20 to see if he needs another transfusion. He agrees to this plan. Reminded him of appointment time of 0800.

## 2020-09-28 ENCOUNTER — Inpatient Hospital Stay: Payer: Medicare Other

## 2020-09-30 ENCOUNTER — Telehealth: Payer: Self-pay | Admitting: Nurse Practitioner

## 2020-09-30 LAB — TYPE AND SCREEN
ABO/RH(D): A NEG
Antibody Screen: NEGATIVE
Unit division: 0
Unit division: 0

## 2020-09-30 LAB — BPAM RBC
Blood Product Expiration Date: 202111112359
Blood Product Expiration Date: 202111112359
ISSUE DATE / TIME: 202110281358
Unit Type and Rh: 600
Unit Type and Rh: 600

## 2020-09-30 NOTE — Telephone Encounter (Signed)
Scheduled appointments per 10/28 los. Spoke to patient who is aware of appointments date and times. Patient is aware of time gap between appointments. That was the only times the appointments could be scheduled, pt is aware.

## 2020-10-01 ENCOUNTER — Other Ambulatory Visit: Payer: Self-pay | Admitting: Nurse Practitioner

## 2020-10-01 ENCOUNTER — Telehealth: Payer: Self-pay

## 2020-10-01 DIAGNOSIS — I2510 Atherosclerotic heart disease of native coronary artery without angina pectoris: Secondary | ICD-10-CM

## 2020-10-01 DIAGNOSIS — C20 Malignant neoplasm of rectum: Secondary | ICD-10-CM

## 2020-10-01 NOTE — Telephone Encounter (Signed)
Pt made aware

## 2020-10-01 NOTE — Telephone Encounter (Signed)
-----   Message from Owens Shark, NP sent at 10/01/2020  1:16 PM EDT ----- Please let him know that cardiology asked [place] to add a lipid panel to labs 10/03/2020.  He should be fasting if possible.

## 2020-10-02 ENCOUNTER — Ambulatory Visit (HOSPITAL_COMMUNITY)
Admission: RE | Admit: 2020-10-02 | Discharge: 2020-10-02 | Disposition: A | Discharge status: Home / Self Care | Payer: Medicare Other | Source: Ambulatory Visit | Attending: Nurse Practitioner | Admitting: Nurse Practitioner

## 2020-10-02 ENCOUNTER — Inpatient Hospital Stay: Payer: Medicare Other

## 2020-10-02 ENCOUNTER — Inpatient Hospital Stay: Payer: Medicare Other | Attending: Oncology

## 2020-10-02 ENCOUNTER — Inpatient Hospital Stay (HOSPITAL_BASED_OUTPATIENT_CLINIC_OR_DEPARTMENT_OTHER): Payer: Medicare Other | Admitting: Nurse Practitioner

## 2020-10-02 ENCOUNTER — Other Ambulatory Visit: Payer: Self-pay | Admitting: Nurse Practitioner

## 2020-10-02 ENCOUNTER — Other Ambulatory Visit: Payer: Self-pay

## 2020-10-02 ENCOUNTER — Encounter: Payer: Self-pay | Admitting: Nurse Practitioner

## 2020-10-02 VITALS — BP 151/72 | HR 57 | Temp 98.0°F | Resp 16 | Ht 72.0 in | Wt 180.0 lb

## 2020-10-02 DIAGNOSIS — T451X5A Adverse effect of antineoplastic and immunosuppressive drugs, initial encounter: Secondary | ICD-10-CM | POA: Insufficient documentation

## 2020-10-02 DIAGNOSIS — I2510 Atherosclerotic heart disease of native coronary artery without angina pectoris: Secondary | ICD-10-CM

## 2020-10-02 DIAGNOSIS — Z923 Personal history of irradiation: Secondary | ICD-10-CM | POA: Diagnosis not present

## 2020-10-02 DIAGNOSIS — D6481 Anemia due to antineoplastic chemotherapy: Secondary | ICD-10-CM | POA: Insufficient documentation

## 2020-10-02 DIAGNOSIS — N19 Unspecified kidney failure: Secondary | ICD-10-CM | POA: Insufficient documentation

## 2020-10-02 DIAGNOSIS — C20 Malignant neoplasm of rectum: Secondary | ICD-10-CM | POA: Insufficient documentation

## 2020-10-02 DIAGNOSIS — D649 Anemia, unspecified: Secondary | ICD-10-CM

## 2020-10-02 DIAGNOSIS — Z933 Colostomy status: Secondary | ICD-10-CM | POA: Insufficient documentation

## 2020-10-02 DIAGNOSIS — Z5111 Encounter for antineoplastic chemotherapy: Secondary | ICD-10-CM | POA: Insufficient documentation

## 2020-10-02 DIAGNOSIS — N179 Acute kidney failure, unspecified: Secondary | ICD-10-CM

## 2020-10-02 DIAGNOSIS — N189 Chronic kidney disease, unspecified: Secondary | ICD-10-CM | POA: Insufficient documentation

## 2020-10-02 DIAGNOSIS — N1330 Unspecified hydronephrosis: Secondary | ICD-10-CM | POA: Diagnosis not present

## 2020-10-02 DIAGNOSIS — Z95828 Presence of other vascular implants and grafts: Secondary | ICD-10-CM

## 2020-10-02 DIAGNOSIS — Z79899 Other long term (current) drug therapy: Secondary | ICD-10-CM | POA: Insufficient documentation

## 2020-10-02 LAB — CBC WITH DIFFERENTIAL (CANCER CENTER ONLY)
Abs Immature Granulocytes: 0.03 10*3/uL (ref 0.00–0.07)
Basophils Absolute: 0 10*3/uL (ref 0.0–0.1)
Basophils Relative: 0 %
Eosinophils Absolute: 0.2 10*3/uL (ref 0.0–0.5)
Eosinophils Relative: 3 %
HCT: 30 % — ABNORMAL LOW (ref 39.0–52.0)
Hemoglobin: 9.5 g/dL — ABNORMAL LOW (ref 13.0–17.0)
Immature Granulocytes: 1 %
Lymphocytes Relative: 11 %
Lymphs Abs: 0.7 10*3/uL (ref 0.7–4.0)
MCH: 28.5 pg (ref 26.0–34.0)
MCHC: 31.7 g/dL (ref 30.0–36.0)
MCV: 90.1 fL (ref 80.0–100.0)
Monocytes Absolute: 0.8 10*3/uL (ref 0.1–1.0)
Monocytes Relative: 13 %
Neutro Abs: 4.7 10*3/uL (ref 1.7–7.7)
Neutrophils Relative %: 72 %
Platelet Count: 275 10*3/uL (ref 150–400)
RBC: 3.33 MIL/uL — ABNORMAL LOW (ref 4.22–5.81)
RDW: 16.9 % — ABNORMAL HIGH (ref 11.5–15.5)
WBC Count: 6.5 10*3/uL (ref 4.0–10.5)
nRBC: 0 % (ref 0.0–0.2)

## 2020-10-02 LAB — CMP (CANCER CENTER ONLY)
ALT: 10 U/L (ref 0–44)
AST: 10 U/L — ABNORMAL LOW (ref 15–41)
Albumin: 2.6 g/dL — ABNORMAL LOW (ref 3.5–5.0)
Alkaline Phosphatase: 76 U/L (ref 38–126)
Anion gap: 9 (ref 5–15)
BUN: 35 mg/dL — ABNORMAL HIGH (ref 8–23)
CO2: 19 mmol/L — ABNORMAL LOW (ref 22–32)
Calcium: 8.9 mg/dL (ref 8.9–10.3)
Chloride: 111 mmol/L (ref 98–111)
Creatinine: 4.19 mg/dL (ref 0.61–1.24)
GFR, Estimated: 15 mL/min — ABNORMAL LOW (ref >60)
Glucose, Bld: 91 mg/dL (ref 70–99)
Potassium: 4.3 mmol/L (ref 3.5–5.1)
Sodium: 139 mmol/L (ref 135–145)
Total Bilirubin: 0.3 mg/dL (ref 0.3–1.2)
Total Protein: 6.7 g/dL (ref 6.5–8.1)

## 2020-10-02 LAB — LIPID PANEL
Cholesterol: 186 mg/dL (ref 0–200)
HDL: 32 mg/dL — ABNORMAL LOW (ref >40)
LDL Cholesterol: 131 mg/dL — ABNORMAL HIGH (ref 0–99)
Total CHOL/HDL Ratio: 5.8 RATIO
Triglycerides: 117 mg/dL (ref <150)
VLDL: 23 mg/dL (ref 0–40)

## 2020-10-02 LAB — SAMPLE TO BLOOD BANK

## 2020-10-02 MED ORDER — SODIUM CHLORIDE 0.9% FLUSH
10.0000 mL | INTRAVENOUS | Status: DC | PRN
  Administered 2020-10-02: 10 mL
  Filled 2020-10-02: qty 10

## 2020-10-02 NOTE — Progress Notes (Signed)
[place] OFFICE PROGRESS NOTE   Diagnosis: Rectal cancer  INTERVAL HISTORY:   Clinton Cordova returns as scheduled.  He completed cycle 7 FOLFOX 09/10/2020.  Cycle 8 was held 09/26/2020 due to mild neutropenia, increase in creatinine.  He was also noted to have progressive symptomatic anemia and was transfused a unit of blood.  He reports overall feeling better after the blood transfusion.  He reports dysuria and hematuria over the past week, progressive worsening.  Symptoms similar to when he has had a urinary tract infection in the past.  He was seen yesterday by PCP and started on nitrofurantoin.  He denies fever.  No back pain.  No nausea.  Objective:  Vital signs in last 24 hours:  Blood pressure (!) 151/72, pulse (!) 57, temperature 98 F (36.7 C), temperature source Oral, resp. rate 16, height 6' (1.829 m), weight 180 lb (81.6 kg), SpO2 99 %.   Resp: Lungs clear bilaterally. Cardio: Regular rate and rhythm. GI: No hepatomegaly. Vascular: No leg edema. Neuro: Alert and oriented. Skin: No rash. Port-A-Cath without erythema.   Lab Results:  Lab Results  Component Value Date   WBC 6.5 10/02/2020   HGB 9.5 (L) 10/02/2020   HCT 30.0 (L) 10/02/2020   MCV 90.1 10/02/2020   PLT 275 10/02/2020   NEUTROABS 4.7 10/02/2020    Imaging:  No results found.  Medications: I have reviewed the patient's current medications.  Assessment/Plan: 1. Metastatic rectal cancer, perforated rectal tumor with abscess, stage IV ? Presented with "diverticulitis "March 2020, status post drainage catheter ? Colonoscopy 10/17/2019-obstructing mass at 10 cm, biopsy confirmed adenocarcinoma ? MRI pelvis 10/22/2019-T3 versus T4, into, M0 lesion-evaluation limited due to abscess ? Neoadjuvant radiation-short course ? 01/17/2020-palliative low anterior resection, end colostomy, biopsy of peritoneal nodules, liver biopsy, left ureter stent, right ureter reimplantation, placement of  pelvic drains  Operative findings included chronic perforation of the rectum with involvement of the pelvic sidewalls and bladder, 2 peritoneal nodules at the sacral promontory, 1 cm mass at segment 4 of the liver  Pathology confirmeda ypT4n, pN1c,M1c,moderately differentiated adenocarcinoma, lymphovascular and perineural invasion present, treatment response score 2, positive distal and radial margins, 0/15 lymph nodes, 1 tumor deposit, no loss of mismatch repair protein expression, MSS, positive for adenocarcinoma involving the right ureter, segment 4 liver, and peritoneal deposits  CTs chest, abdomen, and pelvis 02/23/2020-2 pelvic surgical drains, 2.1 cm hepatic dome hypodensity, 1.8 cm peripheral segment 6/7 lesion, bilateral ureter stents, mild left hydroureteronephrosis, 2 new right lower lobe 0.3 cm nodules-indeterminate  Cycle 1 FOLFOX 03/28/2020  Cycle 2 FOLFOX 04/11/2020  Cycle 3 FOLFOX 04/25/2020  CT 05/21/2020 at [facility]-(compared to CT abdomen/pelvis 02/23/2020 ) mild to moderate right hydroureteronephrosis increased since prior. Slight distal migration of the right ureteral stent with the proximal tip terminating in the proximal right ureter. Mild left hydronephrosis. Similar to prior. Unchanged position of left ureteral stent. Stable sites of mesorectal fascial thickening and decreased size of liver lesions since the comparison study.  Cycle 4 FOLFOX 06/17/2020  Cycle 5 FOLFOX 07/01/2020  Cycle 6 FOLFOX 08/19/2020 (oxaliplatin dose reduced due to elevated creatinine)  CTs at [facility] 09/04/2020-resolution of previously noted pulmonary nodules, no new nodules, new hypodense hepatic dome lesion concerning for worsening hepatic metastatic disease, mild right greater than left hydronephrosis-improved, bilateral ureteral stents  Cycle 7 FOLFOX 09/10/2020 2.Diverticulitis/perforated rectal tumor-pelvic drains remain in place 3.Coronary artery disease 4.Chronic systolic heart  failure 5.History of anLV thrombus, status post course of Eliquis, no  thrombus visualized on echocardiogram 09/15/2019, LVEF 45-50 6.Renal ultrasound 03/15/2020-moderate bilateral hydronephrosis with ureteral stents in position bilaterally--bilateral stent exchangeDr. Lottie Rater, urology at [facility] 03/22/2020 7. Anemia secondary to chemotherapy, chronic disease, and renal failure 8. Renal failure-improved following ureter stent exchange April 2021  Progressive renal failure 05/08/2020-ultrasound with mild to moderate left hydronephrosis, resolution of right hydronephrosis, and left double-J stent was not appreciated  Stent exchange at [facility] 05/22/2020  Progressive renal failure 07/29/2020-chemotherapy held, [facility] urology contacted for stent exchange  Stent exchange August 09, 2020    Disposition: Clinton Cordova has completed 7 cycles of FOLFOX.  Treatment was held last week due to mild neutropenia and a rise in the creatinine.  He also had progression of anemia and was transfused a unit of blood. Review of labs from today show further increase in the creatinine.  We are referring him for a renal ultrasound today and have contacted urology at [facility], currently awaiting a return call.  Chemotherapy remains on hold.    We tentatively scheduled a follow-up visit with chemotherapy in 2 weeks.  We will adjust accordingly pending the above and are available to see him sooner if needed.  Patient seen with Dr. Benay Spice.  Ned Card ANP/GNP-BC   10/02/2020  8:58 AM  This was a shared visit with Ned Card.  Clinton Cordova has progressive renal failure and symptoms of a urinary tract infection.  He is taking antibiotics.  We will refer him for an urgent renal ultrasound.  I contacted the urology service at [facility].  They will evaluate him for stent exchange if the ultrasound confirms hydronephrosis.  Julieanne Manson, MD

## 2020-10-02 NOTE — Progress Notes (Signed)
CRITICAL VALUE STICKER  CRITICAL VALUE: Creatinine 4.19  RECEIVER (on-site recipient of call):Nyeema Want,RN  DATE & TIME NOTIFIED: 10/02/20 @ 0922  MESSENGER (representative from lab):Lelan Pons  MD NOTIFIED: Dr. Luberta Robertson, NP  [place]: [phone]  RESPONSE: Cancel tx today and contacting urology.

## 2020-10-02 NOTE — Patient Instructions (Signed)

## 2020-10-03 ENCOUNTER — Telehealth: Payer: Self-pay | Admitting: Nurse Practitioner

## 2020-10-03 ENCOUNTER — Inpatient Hospital Stay (HOSPITAL_BASED_OUTPATIENT_CLINIC_OR_DEPARTMENT_OTHER): Payer: Medicare Other | Admitting: Nurse Practitioner

## 2020-10-03 ENCOUNTER — Encounter: Payer: Self-pay | Admitting: Nurse Practitioner

## 2020-10-03 ENCOUNTER — Inpatient Hospital Stay: Payer: Medicare Other

## 2020-10-03 ENCOUNTER — Other Ambulatory Visit: Payer: Self-pay

## 2020-10-03 ENCOUNTER — Other Ambulatory Visit: Payer: Self-pay | Admitting: Urology

## 2020-10-03 VITALS — BP 155/80 | HR 60 | Temp 97.7°F | Resp 18 | Ht 72.0 in | Wt 179.4 lb

## 2020-10-03 DIAGNOSIS — Z95828 Presence of other vascular implants and grafts: Secondary | ICD-10-CM

## 2020-10-03 DIAGNOSIS — N179 Acute kidney failure, unspecified: Secondary | ICD-10-CM | POA: Diagnosis not present

## 2020-10-03 DIAGNOSIS — C20 Malignant neoplasm of rectum: Secondary | ICD-10-CM

## 2020-10-03 DIAGNOSIS — I2510 Atherosclerotic heart disease of native coronary artery without angina pectoris: Secondary | ICD-10-CM

## 2020-10-03 DIAGNOSIS — Z5111 Encounter for antineoplastic chemotherapy: Secondary | ICD-10-CM | POA: Diagnosis not present

## 2020-10-03 LAB — CBC WITH DIFFERENTIAL (CANCER CENTER ONLY)
Abs Immature Granulocytes: 0.03 10*3/uL (ref 0.00–0.07)
Basophils Absolute: 0 10*3/uL (ref 0.0–0.1)
Basophils Relative: 0 %
Eosinophils Absolute: 0.2 10*3/uL (ref 0.0–0.5)
Eosinophils Relative: 3 %
HCT: 29.9 % — ABNORMAL LOW (ref 39.0–52.0)
Hemoglobin: 9.3 g/dL — ABNORMAL LOW (ref 13.0–17.0)
Immature Granulocytes: 1 %
Lymphocytes Relative: 12 %
Lymphs Abs: 0.8 10*3/uL (ref 0.7–4.0)
MCH: 27.7 pg (ref 26.0–34.0)
MCHC: 31.1 g/dL (ref 30.0–36.0)
MCV: 89 fL (ref 80.0–100.0)
Monocytes Absolute: 0.8 10*3/uL (ref 0.1–1.0)
Monocytes Relative: 12 %
Neutro Abs: 4.8 10*3/uL (ref 1.7–7.7)
Neutrophils Relative %: 72 %
Platelet Count: 277 10*3/uL (ref 150–400)
RBC: 3.36 MIL/uL — ABNORMAL LOW (ref 4.22–5.81)
RDW: 17 % — ABNORMAL HIGH (ref 11.5–15.5)
WBC Count: 6.6 10*3/uL (ref 4.0–10.5)
nRBC: 0 % (ref 0.0–0.2)

## 2020-10-03 LAB — CMP (CANCER CENTER ONLY)
ALT: 11 U/L (ref 0–44)
AST: 10 U/L — ABNORMAL LOW (ref 15–41)
Albumin: 2.5 g/dL — ABNORMAL LOW (ref 3.5–5.0)
Alkaline Phosphatase: 83 U/L (ref 38–126)
Anion gap: 10 (ref 5–15)
BUN: 37 mg/dL — ABNORMAL HIGH (ref 8–23)
CO2: 18 mmol/L — ABNORMAL LOW (ref 22–32)
Calcium: 9 mg/dL (ref 8.9–10.3)
Chloride: 111 mmol/L (ref 98–111)
Creatinine: 3.9 mg/dL (ref 0.61–1.24)
GFR, Estimated: 16 mL/min — ABNORMAL LOW (ref >60)
Glucose, Bld: 87 mg/dL (ref 70–99)
Potassium: 4.3 mmol/L (ref 3.5–5.1)
Sodium: 139 mmol/L (ref 135–145)
Total Bilirubin: 0.3 mg/dL (ref 0.3–1.2)
Total Protein: 6.8 g/dL (ref 6.5–8.1)

## 2020-10-03 MED ORDER — HEPARIN SOD (PORK) LOCK FLUSH 100 UNIT/ML IV SOLN
500.0000 [IU] | Freq: Once | INTRAVENOUS | Status: AC | PRN
  Administered 2020-10-03: 500 [IU]
  Filled 2020-10-03: qty 5

## 2020-10-03 MED ORDER — SODIUM CHLORIDE 0.9% FLUSH
10.0000 mL | INTRAVENOUS | Status: DC | PRN
  Administered 2020-10-03: 10 mL
  Filled 2020-10-03: qty 10

## 2020-10-03 NOTE — Telephone Encounter (Signed)
Scheduled appointments per 11/3 los. Spoke to patient who is aware of appointments date and time.

## 2020-10-03 NOTE — Patient Instructions (Signed)

## 2020-10-03 NOTE — Progress Notes (Addendum)
[name] OFFICE PROGRESS NOTE   Diagnosis: Rectal cancer  INTERVAL HISTORY:   [name] returns as scheduled.  He was seen yesterday, chemotherapy held due to further increase in creatinine.  Renal ultrasound showed bilateral ureteral stent placement in satisfactory position, stable mild left hydronephrosis, increasing right-sided hydronephrosis.  We contacted urology at [facility].  He returns today for repeat creatinine, follow-up.  He reports having a "rough night".  Continued dysuria and hematuria.  No back pain.  No fever or chills.  No nausea or vomiting.  Objective:  Vital signs in last 24 hours:  Blood pressure (!) 155/80, pulse 60, temperature 97.7 F (36.5 C), temperature source Tympanic, resp. rate 18, height 6' (1.829 m), weight 179 lb 6.4 oz (81.4 kg), SpO2 100 %.    HEENT: No thrush or ulcers. Resp: Lungs clear bilaterally. Cardio: Regular rate and rhythm. GI: Abdomen soft and nontender.  No hepatomegaly.  Left lower quadrant colostomy.  Formed stool in the collection bag. Vascular: No leg edema. Neuro: Alert and oriented. Skin: Warm and dry. Musculoskeletal: No flank pain. Port-A-Cath without erythema.   Lab Results:  Lab Results  Component Value Date   WBC 6.6 10/03/2020   HGB 9.3 (L) 10/03/2020   HCT 29.9 (L) 10/03/2020   MCV 89.0 10/03/2020   PLT 277 10/03/2020   NEUTROABS 4.8 10/03/2020    Imaging:  US RENAL  Result Date: 10/02/2020 CLINICAL DATA:  History of metastatic colon carcinoma and bilateral hydronephrosis. Increasing creatinine with stents in place. EXAM: RENAL / URINARY TRACT ULTRASOUND COMPLETE COMPARISON:  07/30/2020 FINDINGS: Right Kidney: Renal measurements: 12.6 x 6.2 x 5.4 cm. = volume: 223 mL. Ureteral stent is noted in place. Moderate hydronephrosis is noted increased when compared with the prior exam despite stent placement. Left Kidney: Renal measurements: 13.1 x 6.1 x 4.9 cm. = volume: 205 mL. Stent is noted in  place. Dilated collecting system is noted of a mild degree similar to that seen on the prior exam. Bladder: Decompressed. Distal ends of the stents are noted within the bladder. Other: None. IMPRESSION: Bilateral ureteral stent placement in satisfactory position by ultrasound evaluation. Stable mild left hydronephrosis. Increasing right-sided hydronephrosis when compared with the prior exam. Question stent malfunction. Electronically Signed   By: Inez Catalina M.D.   On: 10/02/2020 15:05    Medications: I have reviewed the patient's current medications.  Assessment/Plan: 1. Metastatic rectal cancer, perforated rectal tumor with abscess, stage IV ? Presented with "diverticulitis "March 2020, status post drainage catheter ? Colonoscopy 10/17/2019-obstructing mass at 10 cm, biopsy confirmed adenocarcinoma ? MRI pelvis 10/22/2019-T3 versus T4, into, M0 lesion-evaluation limited due to abscess ? Neoadjuvant radiation-short course ? 01/17/2020-palliative low anterior resection, end colostomy, biopsy of peritoneal nodules, liver biopsy, left ureter stent, right ureter reimplantation, placement of pelvic drains  Operative findings included chronic perforation of the rectum with involvement of the pelvic sidewalls and bladder, 2 peritoneal nodules at the sacral promontory, 1 cm mass at segment 4 of the liver  Pathology confirmeda ypT4n, pN1c,M1c,moderately differentiated adenocarcinoma, lymphovascular and perineural invasion present, treatment response score 2, positive distal and radial margins, 0/15 lymph nodes, 1 tumor deposit, no loss of mismatch repair protein expression, MSS, positive for adenocarcinoma involving the right ureter, segment 4 liver, and peritoneal deposits  CTs chest, abdomen, and pelvis 02/23/2020-2 pelvic surgical drains, 2.1 cm hepatic dome hypodensity, 1.8 cm peripheral segment 6/7 lesion, bilateral ureter stents, mild left hydroureteronephrosis, 2 new right lower lobe 0.3 cm  nodules-indeterminate  Cycle 1 FOLFOX 03/28/2020  Cycle 2 FOLFOX 04/11/2020  Cycle 3 FOLFOX 04/25/2020  CT 05/21/2020 at [facility]-(compared to CT abdomen/pelvis 02/23/2020 ) mild to moderate right hydroureteronephrosis increased since prior. Slight distal migration of the right ureteral stent with the proximal tip terminating in the proximal right ureter. Mild left hydronephrosis. Similar to prior. Unchanged position of left ureteral stent. Stable sites of mesorectal fascial thickening and decreased size of liver lesions since the comparison study.  Cycle 4 FOLFOX 06/17/2020  Cycle 5 FOLFOX 07/01/2020  Cycle 6 FOLFOX 08/19/2020 (oxaliplatin dose reduced due to elevated creatinine)  CTs at [facility] 09/04/2020-resolution of previously noted pulmonary nodules, no new nodules, new hypodense hepatic dome lesion concerning for worsening hepatic metastatic disease, mild right greater than left hydronephrosis-improved, bilateral ureteral stents  Cycle 7 FOLFOX 09/10/2020 2.Diverticulitis/perforated rectal tumor-pelvic drains remain in place 3.Coronary artery disease 4.Chronic systolic heart failure 5.History of anLV thrombus, status post course of Eliquis, no thrombus visualized on echocardiogram 09/15/2019, LVEF 45-50 6.Renal ultrasound 03/15/2020-moderate bilateral hydronephrosis with ureteral stents in position bilaterally--bilateral stent exchangeDr. Lottie Rater, urology at [facility] 03/22/2020 7. Anemia secondary to chemotherapy, chronic disease, and renal failure 8. Renal failure-improved following ureter stent exchange April 2021  Progressive renal failure 05/08/2020-ultrasound with mild to moderate left hydronephrosis, resolution of right hydronephrosis, and left double-J stent was not appreciated  Stent exchange at [facility] 05/22/2020  Progressive renal failure 07/29/2020-chemotherapy held, [name] urology contacted for stent exchange  Stent exchange August 09, 2020  Renal ultrasound  10/02/2020-increasing right hydronephrosis    Disposition: [name] appears unchanged.  He continues to have dysuria and hematuria.  We again reviewed the renal ultrasound result from yesterday which showed increased right hydronephrosis.  [facility] contacted [facility].  Dr. Benay Spice spoke with Dr. Claudia Desanctis.  She will evaluate him this morning for stent exchange, possible nephrostomy tubes.  His treatment will remain on hold until the hydronephrosis has been addressed.  His next appointment here is scheduled on 10/16/2020.  We are available to see him sooner if needed.  Patient seen with Dr. Benay Spice.  Ned Card ANP/GNP-BC   10/03/2020  9:11 AM This was a shared visit with Ned Card.  The creatinine is stable today.  I spoke with urology at [facility] yesterday and they are unable to see him in an expedited fashion.  I contacted Dr. Claudia Desanctis and she graciously agreed to see Mr. Lisenby today.  She will consider ureter stent exchange versus referral for a nephrostomy tube.  He is scheduled to resume chemotherapy in 2 weeks.  Julieanne Manson, MD

## 2020-10-03 NOTE — Patient Instructions (Addendum)
DUE TO COVID-19 ONLY ONE VISITOR IS ALLOWED TO COME WITH YOU AND STAY IN THE WAITING ROOM ONLY DURING PRE OP AND PROCEDURE DAY OF SURGERY. THE 1 VISITOR  MAY VISIT WITH YOU AFTER SURGERY IN YOUR PRIVATE ROOM DURING VISITING HOURS ONLY!  YOU NEED TO HAVE A COVID 19 TEST ON__11-5-21_____ @_______ , THIS TEST MUST BE DONE BEFORE SURGERY,  COVID TESTING SITE [address] [place] [address], IT IS ON THE RIGHT GOING OUT [address] APPROXIMATELY  2 MINUTES PAST [employer] ON THE RIGHT. ONCE YOUR COVID TEST IS COMPLETED,  PLEASE BEGIN THE QUARANTINE INSTRUCTIONS AS OUTLINED IN YOUR HANDOUT.                Clinton Cordova  10/03/2020   Your procedure is scheduled on: 10-08-20   Report to [facility] Main  Entrance   Report to admitting at      1100 AM     Call this number if you have problems the morning of surgery [phone]    Remember: Do not eat food After Midnight. You may have clear liquids until 10:00 am then nothing by mouth     CLEAR LIQUID DIET   Foods Allowed                                                                     Coffee and tea, regular and decaf                                    Plain Jell-O any favor except red or purple                                        Fruit ices (not with fruit pulp)                                    Iced Popsicles                                                                     Cranberry, grape and apple juices Sports drinks like Gatorade Lightly seasoned clear broth or consume(fat free) Sugar, honey syrup  _____________________________________________________________________   BRUSH YOUR TEETH MORNING OF SURGERY AND RINSE YOUR MOUTH OUT, NO CHEWING GUM CANDY OR MINTS.     Take these medicines the morning of surgery with A SIP OF WATER:  Keflex,metoprolol, oxybutin, flomax, rosuvastatin                                 You may not have any metal on your body including hair pins and               piercings  Do not wear jewelry,, lotions, powders or perfumes, deodorant                     Men may shave face and neck.   Do not bring valuables to the hospital. [place].  Contacts, dentures or bridgework may not be worn into surgery.      Patients discharged the day of surgery will not be allowed to drive home. IF YOU ARE HAVING SURGERY AND GOING HOME THE SAME DAY, YOU MUST HAVE AN ADULT TO DRIVE YOU HOME AND BE WITH YOU FOR 24 HOURS. YOU MAY GO HOME BY TAXI OR UBER OR ORTHERWISE, BUT AN ADULT MUST ACCOMPANY YOU HOME AND STAY WITH YOU FOR 24 HOURS.  Name and phone number of your driver:  Special Instructions: N/A              Please read over the following fact sheets you were given: _____________________________________________________________________             [facility] - Preparing for Surgery Before surgery, you can play an important role.  Because skin is not sterile, your skin needs to be as free of germs as possible.  You can reduce the number of germs on your skin by washing with CHG (chlorahexidine gluconate) soap before surgery.  CHG is an antiseptic cleaner which kills germs and bonds with the skin to continue killing germs even after washing. Please DO NOT use if you have an allergy to CHG or antibacterial soaps.  If your skin becomes reddened/irritated stop using the CHG and inform your nurse when you arrive at Short Stay. Do not shave (including legs and underarms) for at least 48 hours prior to the first CHG shower.  You may shave your face/neck. Please follow these instructions carefully:  1.  Shower with CHG Soap the night before surgery and the  morning of Surgery.  2.  If you choose to wash your hair, wash your hair first as usual with your  normal  shampoo.  3.  After you shampoo, rinse your hair and body thoroughly to remove the  shampoo.                           4.  Use CHG as you would any other liquid  soap.  You can apply chg directly  to the skin and wash                       Gently with a scrungie or clean washcloth.  5.  Apply the CHG Soap to your body ONLY FROM THE NECK DOWN.   Do not use on face/ open                           Wound or open sores. Avoid contact with eyes, ears mouth and genitals (private parts).                       Wash face,  Genitals (private parts) with your normal soap.             6.  Wash thoroughly, paying special attention to the area where your surgery  will be performed.  7.  Thoroughly rinse your body with warm water from the neck  down.  8.  DO NOT shower/wash with your normal soap after using and rinsing off  the CHG Soap.                9.  Pat yourself dry with a clean towel.            10.  Wear clean pajamas.            11.  Place clean sheets on your bed the night of your first shower and do not  sleep with pets. Day of Surgery : Do not apply any lotions/deodorants the morning of surgery.  Please wear clean clothes to the hospital/surgery center.  FAILURE TO FOLLOW THESE INSTRUCTIONS MAY RESULT IN THE CANCELLATION OF YOUR SURGERY PATIENT SIGNATURE_________________________________  NURSE SIGNATURE__________________________________  ________________________________________________________________________

## 2020-10-04 ENCOUNTER — Encounter (HOSPITAL_COMMUNITY)
Admission: RE | Admit: 2020-10-04 | Discharge: 2020-10-04 | Disposition: A | Discharge status: Home / Self Care | Payer: Medicare Other | Source: Ambulatory Visit | Attending: Urology | Admitting: Urology

## 2020-10-04 ENCOUNTER — Other Ambulatory Visit: Payer: Self-pay

## 2020-10-04 ENCOUNTER — Other Ambulatory Visit (HOSPITAL_COMMUNITY)
Admission: RE | Admit: 2020-10-04 | Discharge: 2020-10-04 | Disposition: A | Discharge status: Home / Self Care | Payer: Medicare Other | Source: Ambulatory Visit | Attending: Urology | Admitting: Urology

## 2020-10-04 ENCOUNTER — Inpatient Hospital Stay: Payer: Medicare Other

## 2020-10-04 ENCOUNTER — Encounter (HOSPITAL_COMMUNITY): Payer: Self-pay

## 2020-10-04 DIAGNOSIS — Z955 Presence of coronary angioplasty implant and graft: Secondary | ICD-10-CM | POA: Insufficient documentation

## 2020-10-04 DIAGNOSIS — Z01812 Encounter for preprocedural laboratory examination: Secondary | ICD-10-CM | POA: Diagnosis not present

## 2020-10-04 DIAGNOSIS — Z960 Presence of urogenital implants: Secondary | ICD-10-CM | POA: Insufficient documentation

## 2020-10-04 DIAGNOSIS — Z87891 Personal history of nicotine dependence: Secondary | ICD-10-CM | POA: Insufficient documentation

## 2020-10-04 DIAGNOSIS — Z86718 Personal history of other venous thrombosis and embolism: Secondary | ICD-10-CM | POA: Insufficient documentation

## 2020-10-04 DIAGNOSIS — Z20822 Contact with and (suspected) exposure to covid-19: Secondary | ICD-10-CM | POA: Diagnosis not present

## 2020-10-04 DIAGNOSIS — K219 Gastro-esophageal reflux disease without esophagitis: Secondary | ICD-10-CM | POA: Insufficient documentation

## 2020-10-04 DIAGNOSIS — I2510 Atherosclerotic heart disease of native coronary artery without angina pectoris: Secondary | ICD-10-CM | POA: Insufficient documentation

## 2020-10-04 DIAGNOSIS — Z7982 Long term (current) use of aspirin: Secondary | ICD-10-CM | POA: Diagnosis not present

## 2020-10-04 DIAGNOSIS — N179 Acute kidney failure, unspecified: Secondary | ICD-10-CM | POA: Diagnosis not present

## 2020-10-04 DIAGNOSIS — C20 Malignant neoplasm of rectum: Secondary | ICD-10-CM | POA: Diagnosis not present

## 2020-10-04 DIAGNOSIS — N1330 Unspecified hydronephrosis: Secondary | ICD-10-CM | POA: Diagnosis not present

## 2020-10-04 DIAGNOSIS — Z79899 Other long term (current) drug therapy: Secondary | ICD-10-CM | POA: Insufficient documentation

## 2020-10-04 HISTORY — DX: Anemia, unspecified: D64.9

## 2020-10-04 HISTORY — DX: Atherosclerotic heart disease of native coronary artery without angina pectoris: I25.10

## 2020-10-04 HISTORY — DX: Presence of other vascular implants and grafts: Z95.828

## 2020-10-04 HISTORY — DX: Malignant (primary) neoplasm, unspecified: C80.1

## 2020-10-04 HISTORY — DX: Gastro-esophageal reflux disease without esophagitis: K21.9

## 2020-10-04 HISTORY — DX: Personal history of urinary calculi: Z87.442

## 2020-10-04 HISTORY — DX: Overactive bladder: N32.81

## 2020-10-04 LAB — SARS CORONAVIRUS 2 (TAT 6-24 HRS): SARS Coronavirus 2: NEGATIVE

## 2020-10-04 NOTE — Progress Notes (Addendum)
PCP - Jeanella Anton,  NP Cardiologist - Dr. Stevan Born  [phone] [place] 09-18-20  PPM/ICD -  Device Orders -  Rep Notified -   Chest x-ray -  EKG - 05-16-20 on chart Stress Test -  ECHO - [id number] [facility] everywhere Cardiac Cath -   Sleep Study -  CPAP -   Fasting Blood Sugar -  Checks Blood Sugar _____ times a day  Blood Thinner Instructions: Aspirin Instructions:  ERAS Protcol - PRE-SURGERY Ensure or G2-   COVID TEST- 10-04-20  Activity  Can walk a flight of stairs without SOB  CBC?DIFF< CMP done 10-03-20 epic Anesthesia review: Creatine 3.90 , CAD, CHF  Patient denies shortness of breath, fever, cough and chest pain at PAT appointment  none   All instructions explained to the patient, with a verbal understanding of the material. Patient agrees to go over the instructions while at home for a better understanding. Patient also instructed to self quarantine after being tested for COVID-19. The opportunity to ask questions was provided.

## 2020-10-07 NOTE — H&P (Signed)
CC/HPI: cc: Hydronephrosis with acute on chronic kidney injury   10/03/2020: 64 year old man sent over to [facility] today from the cancer center for acute on chronic kidney injury in the setting of bilateral ureteral stents and worsening right hydronephrosis. Patient has a history of rectal cancer and external obstruction managed with bilateral indwelling ureteral stents. These have been placed and exchanged by Dr. Thurmond Butts at [facility]. Patient's oncologist has made multiple times to get in touch with the office to discuss changing stents 1st PCN tube. Patient's creatinine baseline is in the mid 2s and most recently was as high as 4.19. Repeat renal ultrasound showed worsening moderate right hydronephrosis. Patient is also recently been treated for UTI. Dr. Benay Spice said this has happened once before with stents need to be changed early.     ALLERGIES: No Known Drug Allergies    MEDICATIONS: Metoprolol Succinate 100 mg tablet, extended release 24 hr  Oxybutynin Chloride 5 mg tablet  Nitrofurantoin Mono-Macro 100 mg capsule     GU PSH: None   NON-GU PSH: None   GU PMH: None   NON-GU PMH: None   FAMILY HISTORY: None   SOCIAL HISTORY: Marital Status: Divorced Preferred Language: English; Ethnicity: Not Hispanic Or Latino; Race: White Current Smoking Status: Patient does not smoke anymore. Has not smoked since 09/30/1980.   Tobacco Use Assessment Completed: Used Tobacco in last 30 days? Has never drank.  Drinks 2 caffeinated drinks per day.    REVIEW OF SYSTEMS:    GU Review Male:   Patient reports burning/ pain with urination, hard to postpone urination, get up at night to urinate, frequent urination, stream starts and stops, and leakage of urine. Patient denies penile pain, trouble starting your stream, erection problems, and have to strain to urinate .  Gastrointestinal (Upper):   Patient denies nausea, vomiting, and indigestion/ heartburn.  Gastrointestinal (Lower):    Patient denies diarrhea and constipation.  Constitutional:   Patient denies fever, night sweats, weight loss, and fatigue.  Skin:   Patient denies skin rash/ lesion and itching.  Eyes:   Patient denies blurred vision and double vision.  Ears/ Nose/ Throat:   Patient denies sore throat and sinus problems.  Hematologic/Lymphatic:   Patient denies swollen glands and easy bruising.  Cardiovascular:   Patient denies leg swelling and chest pains.  Respiratory:   Patient reports cough. Patient denies shortness of breath.  Endocrine:   Patient denies excessive thirst.  Musculoskeletal:   Patient denies back pain and joint pain.  Neurological:   Patient denies headaches and dizziness.  Psychologic:   Patient denies depression and anxiety.   VITAL SIGNS:      10/03/2020 10:38 AM  Weight 179 lb / 81.19 kg  Height 72 in / 182.88 cm  BP 141/73 mmHg  Pulse 84 /min  Temperature 97.5 F / 36.3 C  BMI 24.3 kg/m   MULTI-SYSTEM PHYSICAL EXAMINATION:    Constitutional: Well-nourished. No physical deformities. Normally developed. Good grooming.  Neck: Neck symmetrical, not swollen. Normal tracheal position.  Respiratory: No labored breathing, no use of accessory muscles.   Skin: No paleness, no jaundice, no cyanosis. No lesion, no ulcer, no rash.  Neurologic / Psychiatric: Oriented to time, oriented to place, oriented to person. No depression, no anxiety, no agitation.  Gastrointestinal: No rigidity, non obese abdomen.   Eyes: Normal conjunctivae. Normal eyelids.  Ears, Nose, Mouth, and Throat: Left ear no scars, no lesions, no masses. Right ear no scars, no lesions, no masses. Nose  no scars, no lesions, no masses. Normal hearing. Normal lips.  Musculoskeletal: Normal gait and station of head and neck.     Complexity of Data:  Records Review:   Previous Hospital Records, POC Tool  Urine Test Review:   Urinalysis  Urodynamics Review:   Review Bladder Scan  X-Ray Review: Renal Ultrasound: Reviewed  Films. Discussed With Patient.    Notes:                     09/26/2020: BUN 37, creatinine 3.31  Creatinine as high as 4.19 yesterday   PROCEDURES:         PVR Ultrasound - 12878  Scanned Volume: 000 cc         Urinalysis w/Scope - 81001 Dipstick Dipstick Cont'd Micro  Color: Amber Bilirubin: Neg WBC/hpf: >60/hpf  Appearance: Cloudy Ketones: Neg RBC/hpf: >60/hpf  Specific Gravity: 1.025 Blood: 3+ Bacteria: Few (10-25/hpf)  pH: 6.0 Protein: 3+ Cystals: NS (Not Seen)  Glucose: Neg Urobilinogen: 0.2 Casts: NS (Not Seen)    Nitrites: Neg Trichomonas: Not Present    Leukocyte Esterase: 3+ Mucous: Not Present      Epithelial Cells: NS (Not Seen)      Yeast: NS (Not Seen)      Sperm: Not Present    Notes:  Microscopic not concentrated due to volume.     ASSESSMENT:      ICD-10 Details  1 GU:   Hydronephrosis - N13.0 Chronic, Worsening - Discussed with patient changing out stents early next week verses a nephrostomy tube. Patient would like to attempt stent change 1st. I will repeat renal ultrasound 1 week after stent placement and check renal function. If stent exchange fails then he will need nephrostomy tube placement by IR.   2   Acute Cystitis/UTI - M76.72 Acute, Uncomplicated - Change patient's antibiotics from nitrofurantoin to cephalexin today as he was not noting improvement with the nitrofurantoin. Will also send urine for culture today.

## 2020-10-07 NOTE — Progress Notes (Signed)
Anesthesia Chart Review   Case: [id number] Date/Time: 10/08/20 1245   Procedure: CYSTOSCOPY WITH RETROGRADE PYELOGRAM/URETERAL STENT EXCHANGE (Bilateral ) - 48 MINS   Anesthesia type: General   Pre-op diagnosis: RECTAL CANCER WITH HYDRONEPHROSIS   Location: [facility]   Surgeons: Robley Fries, MD      DISCUSSION:64 y.o. former smoker with h/o GERD, CAD (CTO of LAD documented on coronary angiogram 7/19 which we are medically managing), LV thrombus 05/2019 (completed 3 month course of anticoagulation, f/u Echo 08/2019 without residual thrombus), systolic heart failure, metastatic rectal cancer with hydronephrosis, renal failure, scheduled for above procedure 10/08/2020 with Dr. Jacalyn Lefevre.    Last seen by cardiology 09/18/2020.  Per OV note doing well from cardiac standpoint, no cv sx.     01/17/2020-palliative low anterior resection, end colostomy, biopsy of peritoneal nodules, liver biopsy, left ureter stent, right ureter reimplantation, placement of pelvic drains.   Progressive renal failure.  Stent exchange 05/22/2020 and 08/09/2020.  Chemotherapy has been held until hydronephrosis has been addressed/improvement of renal function.   VS: BP 135/81   Pulse 80   Temp 36.8 C (Oral)   Resp 18   Ht 6' (1.829 m)   Wt 81.2 kg   SpO2 95%   BMI 24.28 kg/m   PROVIDERS: System, Provider Not In   LABS: Labs reviewed: Acceptable for surgery. (all labs ordered are listed, but only abnormal results are displayed)  Labs Reviewed - No data to display   IMAGES:   EKG:   CV: Echo 09/20/2019 Summary  1. Limited study to assess ventricular function.  2. Borderline leftventricular systolic function, ejection fraction 45-50%.  3. Segmental wall motion abnormality - apical thinning and akinesis.  4. Aortic sclerosis.  5. Normal right ventricular size and systolic function.  6. Echo contrast utilized to enhance endocardial border definition.   Cardiac  Cath 06/21/2018 Findings:  1. Significant 1-vessel coronary artery disease. There is chronic  occlusion of the mid LAD. There is 90% stenosis of DIAG1 prior to the LAD  occlusion.  2. Decreased LV Function. EF estimated 35 %. LVEDP 4 mm Hg  3. Normal Right Heart Filling Pressures   Past Medical History:  Diagnosis Date  . Anemia   . Cancer ([place])    rectal  . Coronary artery disease   . GERD (gastroesophageal reflux disease)   . History of kidney stones   . Overactive bladder   . Port-A-Cath in place     Past Surgical History:  Procedure Laterality Date  . COLON SURGERY    . IR IMAGING GUIDED PORT INSERTION  03/14/2020    MEDICATIONS: . acetaminophen (TYLENOL) 500 MG tablet  . aspirin EC 81 MG tablet  . cephALEXin (KEFLEX) 250 MG capsule  . ferrous sulfate 325 (65 FE) MG tablet  . hydrOXYzine (ATARAX/VISTARIL) 25 MG tablet  . lidocaine-prilocaine (EMLA) cream  . lisinopril (ZESTRIL) 2.5 MG tablet  . loperamide (IMODIUM) 2 MG capsule  . metoprolol succinate (TOPROL-XL) 100 MG 24 hr tablet  . naproxen sodium (ALEVE) 220 MG tablet  . nitrofurantoin, macrocrystal-monohydrate, (MACROBID) 100 MG capsule  . ondansetron (ZOFRAN-ODT) 4 MG disintegrating tablet  . oxybutynin (DITROPAN) 5 MG tablet  . phenazopyridine (AZO-STANDARD) 95 MG tablet  . prochlorperazine (COMPAZINE) 10 MG tablet  . rosuvastatin (CRESTOR) 20 MG tablet  . solifenacin (VESICARE) 10 MG tablet  . tamsulosin (FLOMAX) 0.4 MG CAPS capsule   No current facility-administered medications for this encounter.    Konrad Felix,  PA-C [facility] Pre-Surgical Testing [phone]

## 2020-10-08 ENCOUNTER — Other Ambulatory Visit: Payer: Self-pay

## 2020-10-08 ENCOUNTER — Ambulatory Visit (HOSPITAL_COMMUNITY): Payer: Medicare Other | Admitting: Certified Registered Nurse Anesthetist

## 2020-10-08 ENCOUNTER — Encounter (HOSPITAL_COMMUNITY): Payer: Self-pay | Admitting: Urology

## 2020-10-08 ENCOUNTER — Encounter (HOSPITAL_COMMUNITY)
Admission: RE | Disposition: A | Discharge status: Home / Self Care | Payer: Self-pay | Source: Home / Self Care | Attending: Urology

## 2020-10-08 ENCOUNTER — Ambulatory Visit (HOSPITAL_COMMUNITY)
Admission: RE | Admit: 2020-10-08 | Discharge: 2020-10-08 | Disposition: A | Discharge status: Home / Self Care | Payer: Medicare Other | Attending: Urology | Admitting: Urology

## 2020-10-08 ENCOUNTER — Ambulatory Visit (HOSPITAL_COMMUNITY): Payer: Medicare Other

## 2020-10-08 DIAGNOSIS — Y838 Other surgical procedures as the cause of abnormal reaction of the patient, or of later complication, without mention of misadventure at the time of the procedure: Secondary | ICD-10-CM | POA: Diagnosis not present

## 2020-10-08 DIAGNOSIS — Z79899 Other long term (current) drug therapy: Secondary | ICD-10-CM | POA: Diagnosis not present

## 2020-10-08 DIAGNOSIS — Z87891 Personal history of nicotine dependence: Secondary | ICD-10-CM | POA: Diagnosis not present

## 2020-10-08 DIAGNOSIS — T83112A Breakdown (mechanical) of urinary stent, initial encounter: Secondary | ICD-10-CM | POA: Insufficient documentation

## 2020-10-08 DIAGNOSIS — N131 Hydronephrosis with ureteral stricture, not elsewhere classified: Secondary | ICD-10-CM | POA: Diagnosis not present

## 2020-10-08 DIAGNOSIS — Y731 Therapeutic (nonsurgical) and rehabilitative gastroenterology and urology devices associated with adverse incidents: Secondary | ICD-10-CM | POA: Diagnosis not present

## 2020-10-08 DIAGNOSIS — C20 Malignant neoplasm of rectum: Secondary | ICD-10-CM | POA: Diagnosis not present

## 2020-10-08 HISTORY — PX: CYSTOSCOPY W/ URETERAL STENT PLACEMENT: SHX1429

## 2020-10-08 LAB — BASIC METABOLIC PANEL
Anion gap: 7 (ref 5–15)
BUN: 33 mg/dL — ABNORMAL HIGH (ref 8–23)
CO2: 20 mmol/L — ABNORMAL LOW (ref 22–32)
Calcium: 8.4 mg/dL — ABNORMAL LOW (ref 8.9–10.3)
Chloride: 110 mmol/L (ref 98–111)
Creatinine, Ser: 3.16 mg/dL — ABNORMAL HIGH (ref 0.61–1.24)
GFR, Estimated: 21 mL/min — ABNORMAL LOW (ref >60)
Glucose, Bld: 92 mg/dL (ref 70–99)
Potassium: 4.9 mmol/L (ref 3.5–5.1)
Sodium: 137 mmol/L (ref 135–145)

## 2020-10-08 SURGERY — CYSTOSCOPY, WITH RETROGRADE PYELOGRAM AND URETERAL STENT INSERTION
Anesthesia: General | Laterality: Bilateral

## 2020-10-08 MED ORDER — SODIUM CHLORIDE 0.9 % IR SOLN
Status: DC | PRN
  Administered 2020-10-08: 6000 mL

## 2020-10-08 MED ORDER — LACTATED RINGERS IV SOLN: INTRAVENOUS | Status: DC

## 2020-10-08 MED ORDER — FENTANYL CITRATE (PF) 100 MCG/2ML IJ SOLN
INTRAMUSCULAR | Status: DC | PRN
  Administered 2020-10-08 (×3): 50 ug via INTRAVENOUS

## 2020-10-08 MED ORDER — PROPOFOL 10 MG/ML IV BOLUS
INTRAVENOUS | Status: DC | PRN
  Administered 2020-10-08: 190 mg via INTRAVENOUS

## 2020-10-08 MED ORDER — GLYCOPYRROLATE 0.2 MG/ML IJ SOLN
INTRAMUSCULAR | Status: DC | PRN
  Administered 2020-10-08: .2 mg via INTRAVENOUS

## 2020-10-08 MED ORDER — HYDROMORPHONE HCL 1 MG/ML IJ SOLN: 0.2500 mg | INTRAMUSCULAR | Status: DC | PRN

## 2020-10-08 MED ORDER — DEXMEDETOMIDINE (PRECEDEX) IN NS 20 MCG/5ML (4 MCG/ML) IV SYRINGE
PREFILLED_SYRINGE | INTRAVENOUS | Status: DC | PRN
  Administered 2020-10-08: 4 ug via INTRAVENOUS
  Administered 2020-10-08: 8 ug via INTRAVENOUS
  Administered 2020-10-08: 4 ug via INTRAVENOUS

## 2020-10-08 MED ORDER — ONDANSETRON HCL 4 MG/2ML IJ SOLN: 4.0000 mg | Freq: Once | INTRAMUSCULAR | Status: DC | PRN

## 2020-10-08 MED ORDER — 0.9 % SODIUM CHLORIDE (POUR BTL) OPTIME
TOPICAL | Status: DC | PRN
  Administered 2020-10-08: 1000 mL

## 2020-10-08 MED ORDER — IOHEXOL 300 MG/ML  SOLN
INTRAMUSCULAR | Status: DC | PRN
  Administered 2020-10-08: 27 mL

## 2020-10-08 MED ORDER — OXYCODONE HCL 5 MG PO TABS
5.0000 mg | ORAL_TABLET | Freq: Once | ORAL | Status: AC
  Administered 2020-10-08: 5 mg via ORAL

## 2020-10-08 MED ORDER — FENTANYL CITRATE (PF) 100 MCG/2ML IJ SOLN
INTRAMUSCULAR | Status: AC
  Filled 2020-10-08: qty 2

## 2020-10-08 MED ORDER — PHENYLEPHRINE 40 MCG/ML (10ML) SYRINGE FOR IV PUSH (FOR BLOOD PRESSURE SUPPORT)
PREFILLED_SYRINGE | INTRAVENOUS | Status: DC | PRN
  Administered 2020-10-08: 40 ug via INTRAVENOUS
  Administered 2020-10-08: 80 ug via INTRAVENOUS
  Administered 2020-10-08 (×2): 40 ug via INTRAVENOUS
  Administered 2020-10-08 (×2): 80 ug via INTRAVENOUS

## 2020-10-08 MED ORDER — MIDAZOLAM HCL 2 MG/2ML IJ SOLN
INTRAMUSCULAR | Status: AC
  Filled 2020-10-08: qty 2

## 2020-10-08 MED ORDER — CHLORHEXIDINE GLUCONATE 0.12 % MT SOLN
15.0000 mL | Freq: Once | OROMUCOSAL | Status: AC
  Administered 2020-10-08: 15 mL via OROMUCOSAL

## 2020-10-08 MED ORDER — ONDANSETRON HCL 4 MG/2ML IJ SOLN
INTRAMUSCULAR | Status: DC | PRN
  Administered 2020-10-08: 4 mg via INTRAVENOUS

## 2020-10-08 MED ORDER — GLYCOPYRROLATE PF 0.2 MG/ML IJ SOSY
PREFILLED_SYRINGE | INTRAMUSCULAR | Status: AC
  Filled 2020-10-08: qty 1

## 2020-10-08 MED ORDER — METOPROLOL SUCCINATE ER 100 MG PO TB24
100.0000 mg | ORAL_TABLET | ORAL | Status: AC
  Administered 2020-10-08: 100 mg via ORAL
  Filled 2020-10-08: qty 1

## 2020-10-08 MED ORDER — PHENYLEPHRINE 40 MCG/ML (10ML) SYRINGE FOR IV PUSH (FOR BLOOD PRESSURE SUPPORT)
PREFILLED_SYRINGE | INTRAVENOUS | Status: AC
  Filled 2020-10-08: qty 10

## 2020-10-08 MED ORDER — DEXAMETHASONE SODIUM PHOSPHATE 4 MG/ML IJ SOLN
INTRAMUSCULAR | Status: DC | PRN
  Administered 2020-10-08: 8 mg via INTRAVENOUS

## 2020-10-08 MED ORDER — LIDOCAINE 2% (20 MG/ML) 5 ML SYRINGE
INTRAMUSCULAR | Status: DC | PRN
  Administered 2020-10-08: 100 mg via INTRAVENOUS

## 2020-10-08 MED ORDER — ACETAMINOPHEN 10 MG/ML IV SOLN: 1000.0000 mg | Freq: Once | INTRAVENOUS | Status: DC | PRN

## 2020-10-08 MED ORDER — OXYCODONE HCL 5 MG PO TABS
ORAL_TABLET | ORAL | Status: AC
  Filled 2020-10-08: qty 1

## 2020-10-08 MED ORDER — MIDAZOLAM HCL 5 MG/5ML IJ SOLN
INTRAMUSCULAR | Status: DC | PRN
  Administered 2020-10-08: 2 mg via INTRAVENOUS

## 2020-10-08 MED ORDER — SODIUM CHLORIDE 0.9 % IV SOLN
1.0000 g | INTRAVENOUS | Status: AC
  Administered 2020-10-08: 1 g via INTRAVENOUS
  Filled 2020-10-08: qty 1

## 2020-10-08 MED ORDER — DEXMEDETOMIDINE (PRECEDEX) IN NS 20 MCG/5ML (4 MCG/ML) IV SYRINGE
PREFILLED_SYRINGE | INTRAVENOUS | Status: AC
  Filled 2020-10-08: qty 5

## 2020-10-08 MED ORDER — FENTANYL CITRATE (PF) 100 MCG/2ML IJ SOLN
25.0000 ug | INTRAMUSCULAR | Status: DC | PRN
  Administered 2020-10-08: 50 ug via INTRAVENOUS

## 2020-10-08 SURGICAL SUPPLY — 16 items
BAG URO CATCHER STRL LF (MISCELLANEOUS) ×3 IMPLANT
CATH INTERMIT  6FR 70CM (CATHETERS) ×3 IMPLANT
CLOTH BEACON ORANGE TIMEOUT ST (SAFETY) ×3 IMPLANT
GLOVE BIO SURGEON STRL SZ 6.5 (GLOVE) ×2 IMPLANT
GLOVE BIO SURGEONS STRL SZ 6.5 (GLOVE) ×1
GOWN STRL REUS W/ TWL LRG LVL3 (GOWN DISPOSABLE) ×1 IMPLANT
GOWN STRL REUS W/TWL LRG LVL3 (GOWN DISPOSABLE) ×9 IMPLANT
GUIDEWIRE STR DUAL SENSOR (WIRE) ×3 IMPLANT
KIT TURNOVER KIT A (KITS) ×3 IMPLANT
MANIFOLD NEPTUNE II (INSTRUMENTS) ×3 IMPLANT
PACK CYSTO (CUSTOM PROCEDURE TRAY) ×3 IMPLANT
STENT URET 6FRX26 CONTOUR (STENTS) ×3 IMPLANT
STENT URET 6FRX28 CONTOUR (STENTS) ×3 IMPLANT
TUBING CONNECTING 10 (TUBING) ×2 IMPLANT
TUBING CONNECTING 10' (TUBING) ×1
TUBING UROLOGY SET (TUBING) ×3 IMPLANT

## 2020-10-08 NOTE — Anesthesia Preprocedure Evaluation (Signed)
Anesthesia Evaluation  Patient identified by MRN, date of birth, ID band Patient awake    Reviewed: Patient's Chart, lab work & pertinent test results  Airway Mallampati: II  TM Distance: >3 FB Neck ROM: Full    Dental  (+) Chipped   Pulmonary neg pulmonary ROS, former smoker,    Pulmonary exam normal        Cardiovascular hypertension, Pt. on medications and Pt. on home beta blockers + CAD   Rhythm:Regular Rate:Normal     Neuro/Psych negative neurological ROS  negative psych ROS   GI/Hepatic Neg liver ROS, GERD  Medicated,Rectal cancer   Endo/Other  negative endocrine ROS  Renal/GU hydronephrosis     Musculoskeletal negative musculoskeletal ROS (+)   Abdominal (+)  Abdomen: soft. Bowel sounds: normal.  Peds  Hematology  (+) anemia ,   Anesthesia Other Findings   Reproductive/Obstetrics                            Anesthesia Physical Anesthesia Plan  ASA: III  Anesthesia Plan: General   Post-op Pain Management:    Induction: Intravenous  PONV Risk Score and Plan: 2 and Ondansetron, Dexamethasone and Treatment may vary due to age or medical condition  Airway Management Planned: Mask and LMA  Additional Equipment: None  Intra-op Plan:   Post-operative Plan: Extubation in OR  Informed Consent: I have reviewed the patients History and Physical, chart, labs and discussed the procedure including the risks, benefits and alternatives for the proposed anesthesia with the patient or authorized representative who has indicated his/her understanding and acceptance.     Dental advisory given  Plan Discussed with: CRNA  Anesthesia Plan Comments: (Lab Results      Component                Value               Date                      WBC                      6.6                 10/03/2020                HGB                      9.3 (L)             10/03/2020                HCT                       29.9 (L)            10/03/2020                MCV                      89.0                10/03/2020                PLT                      277  10/03/2020          )       Anesthesia Quick Evaluation

## 2020-10-08 NOTE — Discharge Instructions (Signed)
Ureteral Stent  Patient Education A stent is a hollow tube that maintains patency until healing can take place or an obstruction is relieved. It allows urine to flow from the kidney to the bladder.  Indications for stenting include: . Relief of ureteral obstruction (stones, cancer, stricture) and provide drainage; . Promote healing of the ureter by providing internal support after a ureteral procedure  . Prevent potential complications by helping place a guidewire into the ureter; . Assist in dilating the ureter before the next ureteroscopy; Marland Kitchen Bypass obstructions, either from internal or external causes;  Procedure: Marland Kitchen Under general anesthesia in a cystoscopy suite, the ureteroscope is introduced into the bladder through the urethra and then up into the desired ureter.   Stent Removal: . Remove in 2-7 days after ureteroscopy in uncomplicated cases; . Remove in 1-2 weeks in cases of ureteral perforation or persisting concern of obstruction; . A string may be left on your stent and you will be instructed by your provider when to gently pull the string to remove your stent at home.  . Can be removed in the office with topical anesthesia with a flexible ureteroscope and grasper; . Can be removed in the cysto suite under anesthesia for patients unable to tolerate topical anesthesia; . If required for long term use (extrinsic compression by tumor, stricture), stents should be changed every 6 months  What to expect while stent is in place . Blood in the urine intermittently while stent is in place. Usually it is most severe the first few days after surgery, but it can persist the entire time that the stent is in place. . Symptoms of urinary frequency and urgency that is caused by the lower end of the stent coiling into and irritating the bladder.  Precautions: . Do not have sexual intercourse while stent is in place or participate in other strenuous activity.  Please notify your physician's  office as soon as possible if: . your stent falls out. It is very rare but if the stent comes out, please rinse it with water, place it in a ziplock bag and bring it with you to your appointment. . you are experiencing extreme pain, prolonged nausea/vomiting or fever >100.5 F

## 2020-10-08 NOTE — Op Note (Signed)
Preoperative diagnosis:  1. Hydronephrosis secondary to rectal cancer; stent malfunction  Postoperative diagnosis:  1. Same  Procedure:  1. Cystoscopy 2. bilateral ureteral stent exchange (LEFT: 6 x 28, RIGHT: 6 x 26) 3. bilateral retrograde pyelography with interpretation   Surgeon: Jacalyn Lefevre, MD  Anesthesia: General  Complications: None  Intraoperative findings:  1.  Normal urethra 2.  Left ureteral orifice deviated slightly laterally questionable prior resection 3.  Right ureteral orifice in right superior posterior wall consistent with ureteral reimplant 4.  Left retrograde pyelogram with hydronephrosis to level of bladder 5.  Right retrograde pyelogram with 2 strictured areas in the distal to mid ureter and then again in the proximal ureter with dilated ureter in between and questionable calculus and proximal dilated ureter  EBL: Minimal  Specimens: None  Indication: Clinton Cordova is a 64 y.o. patient with rectal cancer and bilateral hydronephrosis managed with indwelling ureteral stents.  Initial ureteral stents were placed at [facility] and have been exchanged every 3 to 6 months.  He has had difficulty with recurrent obstruction/malfunction of stents.  Patient's kidney function has been worsening and renal ultrasound shows worsening hydronephrosis.  After reviewing the management options for treatment, he elected to proceed with the above surgical procedure(s). We have discussed the potential benefits and risks of the procedure, side effects of the proposed treatment, the likelihood of the patient achieving the goals of the procedure, and any potential problems that might occur during the procedure or recuperation. Informed consent has been obtained.  Description of procedure:  The patient was taken to the operating room and general anesthesia was induced.  The patient was placed in the dorsal lithotomy position, prepped and draped in the usual sterile fashion, and  preoperative antibiotics were administered. A preoperative time-out was performed.   Cystourethroscopy was performed.  The patient's urethra was examined and was normal.  The bladder was then systematically examined in its entirety. There was no evidence for any bladder tumors, stones, or other mucosal pathology.  The mucosa at the trigone was shaggy and sloughing with erythema.    Attention then turned to the left ureteral orifice and a sensor wire was placed alongside the existing ureteral stent.  Graspers were then used to remove the ureteral stent.  Next, a ureteral catheter was placed over the wire into the distal ureter and a retrograde pyelogram was obtained.  Findings are noted above.  The open-ended ureteral catheter was removed and the wire was replaced with fluoroscopic guidance back to the kidney.  A 6 French by 28 cm left ureteral stent was then placed with moderate resistance.   The stent was positioned appropriately under fluoroscopic and cystoscopic guidance.  The wire was then removed with an adequate stent curl noted in the renal pelvis as well as in the bladder.  Attention then turned to the right side where the exact same procedure was performed.  A 6 French by 26 cm stent was used in this location.  The bladder was then emptied and the procedure ended.  The patient appeared to tolerate the procedure well and without complications.  The patient was able to be awakened and transferred to the recovery unit in satisfactory condition.   Follow-up: Patient will have BMP drawn in PACU.  He has repeat renal ultrasound scheduled next week.  If stents fail will proceed with bilateral nephrostomy tubes.  In addition at next stent exchange he needs a 6 x 26 on the left and a 6 x 24  on the right.  Jacalyn Lefevre, M.D.

## 2020-10-08 NOTE — Interval H&P Note (Signed)
History and Physical Interval Note:  10/08/2020 11:49 AM  Clinton Cordova  has presented today for surgery, with the diagnosis of RECTAL CANCER WITH HYDRONEPHROSIS.  The various methods of treatment have been discussed with the patient and family. After consideration of risks, benefits and other options for treatment, the patient has consented to  Procedure(s) with comments: CYSTOSCOPY WITH RETROGRADE PYELOGRAM/URETERAL STENT EXCHANGE (Bilateral) - 30 MINS as a surgical intervention.  The patient's history has been reviewed, patient examined, no change in status, stable for surgery.  I have reviewed the patient's chart and labs.  Questions were answered to the patient's satisfaction.     Breylen Agyeman D Cathy Ropp

## 2020-10-08 NOTE — Anesthesia Postprocedure Evaluation (Signed)
Anesthesia Post Note  Patient: Educational psychologist  Procedure(s) Performed: CYSTOSCOPY WITH BILATERAL RETROGRADE PYELOGRAM/BILATERAL URETERAL STENT EXCHANGE (Bilateral )     Patient location during evaluation: PACU Anesthesia Type: General Level of consciousness: awake and alert Pain management: pain level controlled Vital Signs Assessment: post-procedure vital signs reviewed and stable Respiratory status: spontaneous breathing, nonlabored ventilation and respiratory function stable Cardiovascular status: blood pressure returned to baseline and stable Postop Assessment: no apparent nausea or vomiting Anesthetic complications: no   No complications documented.  Last Vitals:  Vitals:   10/08/20 1500 10/08/20 1515  BP: 119/73 (!) 145/80  Pulse: 66 67  Resp: 15 15  Temp:  36.4 C  SpO2: 100% 96%    Last Pain:  Vitals:   10/08/20 1515  TempSrc:   PainSc: 0-No pain                 Lidia Collum

## 2020-10-08 NOTE — Transfer of Care (Signed)
Immediate Anesthesia Transfer of Care Note  Patient: Clinton Cordova  Procedure(s) Performed: CYSTOSCOPY WITH BILATERAL RETROGRADE PYELOGRAM/BILATERAL URETERAL STENT EXCHANGE (Bilateral )  Patient Location: PACU  Anesthesia Type:General  Level of Consciousness: drowsy  Airway & Oxygen Therapy: Patient Spontanous Breathing and Patient connected to face mask oxygen  Post-op Assessment: Report given to RN and Post -op Vital signs reviewed and stable  Post vital signs: Reviewed and stable  Last Vitals:  Vitals Value Taken Time  BP 98/59 10/08/20 1445  Temp 36.4 C 10/08/20 1445  Pulse 65 10/08/20 1446  Resp 14 10/08/20 1446  SpO2 100 % 10/08/20 1446  Vitals shown include unvalidated device data.  Last Pain:  Vitals:   10/08/20 1135  TempSrc:   PainSc: 0-No pain         Complications: No complications documented.

## 2020-10-08 NOTE — Anesthesia Procedure Notes (Signed)
Procedure Name: LMA Insertion Date/Time: 10/08/2020 2:10 PM Performed by: Lavina Hamman, CRNA Pre-anesthesia Checklist: Patient identified, Emergency Drugs available, Suction available and Patient being monitored Patient Re-evaluated:Patient Re-evaluated prior to induction Oxygen Delivery Method: Circle System Utilized Preoxygenation: Pre-oxygenation with 100% oxygen Induction Type: IV induction Ventilation: Mask ventilation without difficulty LMA: LMA inserted LMA Size: 4.0 Number of attempts: 1 Airway Equipment and Method: Bite block Placement Confirmation: positive ETCO2 Tube secured with: Tape Dental Injury: Teeth and Oropharynx as per pre-operative assessment

## 2020-10-08 NOTE — OR Nursing (Signed)
Bilateral ureteral stents removed by MD Pace.

## 2020-10-09 ENCOUNTER — Encounter (HOSPITAL_COMMUNITY): Payer: Self-pay | Admitting: Urology

## 2020-10-15 ENCOUNTER — Other Ambulatory Visit: Payer: Self-pay | Admitting: *Deleted

## 2020-10-15 DIAGNOSIS — C20 Malignant neoplasm of rectum: Secondary | ICD-10-CM

## 2020-10-16 ENCOUNTER — Encounter: Payer: Self-pay | Admitting: Nurse Practitioner

## 2020-10-16 ENCOUNTER — Other Ambulatory Visit: Payer: Self-pay

## 2020-10-16 ENCOUNTER — Encounter: Payer: Self-pay | Admitting: *Deleted

## 2020-10-16 ENCOUNTER — Inpatient Hospital Stay (HOSPITAL_BASED_OUTPATIENT_CLINIC_OR_DEPARTMENT_OTHER): Payer: Medicare Other | Admitting: Nurse Practitioner

## 2020-10-16 ENCOUNTER — Inpatient Hospital Stay: Payer: Medicare Other

## 2020-10-16 VITALS — BP 144/69 | HR 92 | Temp 98.3°F | Resp 18 | Ht 72.0 in | Wt 176.0 lb

## 2020-10-16 DIAGNOSIS — C20 Malignant neoplasm of rectum: Secondary | ICD-10-CM

## 2020-10-16 DIAGNOSIS — Z5111 Encounter for antineoplastic chemotherapy: Secondary | ICD-10-CM | POA: Diagnosis not present

## 2020-10-16 DIAGNOSIS — I2510 Atherosclerotic heart disease of native coronary artery without angina pectoris: Secondary | ICD-10-CM

## 2020-10-16 LAB — CBC WITH DIFFERENTIAL (CANCER CENTER ONLY)
Abs Immature Granulocytes: 0.01 10*3/uL (ref 0.00–0.07)
Basophils Absolute: 0 10*3/uL (ref 0.0–0.1)
Basophils Relative: 1 %
Eosinophils Absolute: 0.3 10*3/uL (ref 0.0–0.5)
Eosinophils Relative: 4 %
HCT: 27.1 % — ABNORMAL LOW (ref 39.0–52.0)
Hemoglobin: 8.4 g/dL — ABNORMAL LOW (ref 13.0–17.0)
Immature Granulocytes: 0 %
Lymphocytes Relative: 12 %
Lymphs Abs: 0.8 10*3/uL (ref 0.7–4.0)
MCH: 28.5 pg (ref 26.0–34.0)
MCHC: 31 g/dL (ref 30.0–36.0)
MCV: 91.9 fL (ref 80.0–100.0)
Monocytes Absolute: 0.8 10*3/uL (ref 0.1–1.0)
Monocytes Relative: 12 %
Neutro Abs: 4.5 10*3/uL (ref 1.7–7.7)
Neutrophils Relative %: 71 %
Platelet Count: 318 10*3/uL (ref 150–400)
RBC: 2.95 MIL/uL — ABNORMAL LOW (ref 4.22–5.81)
RDW: 17.1 % — ABNORMAL HIGH (ref 11.5–15.5)
WBC Count: 6.4 10*3/uL (ref 4.0–10.5)
nRBC: 0 % (ref 0.0–0.2)

## 2020-10-16 LAB — CMP (CANCER CENTER ONLY)
ALT: 6 U/L (ref 0–44)
AST: 8 U/L — ABNORMAL LOW (ref 15–41)
Albumin: 2.7 g/dL — ABNORMAL LOW (ref 3.5–5.0)
Alkaline Phosphatase: 85 U/L (ref 38–126)
Anion gap: 8 (ref 5–15)
BUN: 41 mg/dL — ABNORMAL HIGH (ref 8–23)
CO2: 18 mmol/L — ABNORMAL LOW (ref 22–32)
Calcium: 9.1 mg/dL (ref 8.9–10.3)
Chloride: 113 mmol/L — ABNORMAL HIGH (ref 98–111)
Creatinine: 3.1 mg/dL (ref 0.61–1.24)
GFR, Estimated: 21 mL/min — ABNORMAL LOW (ref >60)
Glucose, Bld: 136 mg/dL — ABNORMAL HIGH (ref 70–99)
Potassium: 4.4 mmol/L (ref 3.5–5.1)
Sodium: 139 mmol/L (ref 135–145)
Total Bilirubin: 0.3 mg/dL (ref 0.3–1.2)
Total Protein: 7.3 g/dL (ref 6.5–8.1)

## 2020-10-16 MED ORDER — SODIUM CHLORIDE 0.9% FLUSH
10.0000 mL | INTRAVENOUS | Status: DC | PRN
  Filled 2020-10-16: qty 10

## 2020-10-16 MED ORDER — SODIUM CHLORIDE 0.9 % IV SOLN
2400.0000 mg/m2 | INTRAVENOUS | Status: DC
  Administered 2020-10-16: 4650 mg via INTRAVENOUS
  Filled 2020-10-16: qty 93

## 2020-10-16 MED ORDER — DEXTROSE 5 % IV SOLN
Freq: Once | INTRAVENOUS | Status: AC
  Filled 2020-10-16: qty 250

## 2020-10-16 MED ORDER — PALONOSETRON HCL INJECTION 0.25 MG/5ML
INTRAVENOUS | Status: AC
  Filled 2020-10-16: qty 5

## 2020-10-16 MED ORDER — SODIUM CHLORIDE 0.9 % IV SOLN
10.0000 mg | Freq: Once | INTRAVENOUS | Status: AC
  Administered 2020-10-16: 10 mg via INTRAVENOUS
  Filled 2020-10-16 (×2): qty 1
  Filled 2020-10-16: qty 10

## 2020-10-16 MED ORDER — PALONOSETRON HCL INJECTION 0.25 MG/5ML
0.2500 mg | Freq: Once | INTRAVENOUS | Status: AC
  Administered 2020-10-16: 0.25 mg via INTRAVENOUS

## 2020-10-16 MED ORDER — FLUOROURACIL CHEMO INJECTION 2.5 GM/50ML
400.0000 mg/m2 | Freq: Once | INTRAVENOUS | Status: AC
  Administered 2020-10-16: 800 mg via INTRAVENOUS
  Filled 2020-10-16: qty 16

## 2020-10-16 MED ORDER — OXALIPLATIN CHEMO INJECTION 100 MG/20ML
65.0000 mg/m2 | Freq: Once | INTRAVENOUS | Status: AC
  Administered 2020-10-16: 125 mg via INTRAVENOUS
  Filled 2020-10-16: qty 20

## 2020-10-16 MED ORDER — LEUCOVORIN CALCIUM INJECTION 350 MG
400.0000 mg/m2 | Freq: Once | INTRAVENOUS | Status: AC
  Administered 2020-10-16: 776 mg via INTRAVENOUS
  Filled 2020-10-16: qty 38.8

## 2020-10-16 MED ORDER — HEPARIN SOD (PORK) LOCK FLUSH 100 UNIT/ML IV SOLN
500.0000 [IU] | Freq: Once | INTRAVENOUS | Status: DC | PRN
  Filled 2020-10-16: qty 5

## 2020-10-16 NOTE — Progress Notes (Signed)
Blood return noted before, during, and after 5FU IV Injection.

## 2020-10-16 NOTE — Progress Notes (Signed)
CRITICAL VALUE STICKER  CRITICAL VALUE:creatinine 3.10   RECEIVER (on-site recipient of call):Yadhira Mckneely,RN  DATE & TIME NOTIFIED: 10/16/20 @ 1303  MESSENGER (representative from lab):Lelan Pons  MD NOTIFIED: Ned Card, NP  TIME OF NOTIFICATION: 1309  RESPONSE: Proceed w/tx as scheduled

## 2020-10-16 NOTE — Patient Instructions (Signed)
[facility] Discharge Instructions for Patients Receiving Chemotherapy  Today you received the following chemotherapy agents: Oxaliplatin, Leucovorin, and Fluorouracil  To help prevent nausea and vomiting after your treatment, we encourage you to take your nausea medication as directed by your MD.   If you develop nausea and vomiting that is not controlled by your nausea medication, call the clinic.   BELOW ARE SYMPTOMS THAT SHOULD BE REPORTED IMMEDIATELY:  *FEVER GREATER THAN 100.5 F  *CHILLS WITH OR WITHOUT FEVER  NAUSEA AND VOMITING THAT IS NOT CONTROLLED WITH YOUR NAUSEA MEDICATION  *UNUSUAL SHORTNESS OF BREATH  *UNUSUAL BRUISING OR BLEEDING  TENDERNESS IN MOUTH AND THROAT WITH OR WITHOUT PRESENCE OF ULCERS  *URINARY PROBLEMS  *BOWEL PROBLEMS  UNUSUAL RASH Items with * indicate a potential emergency and should be followed up as soon as possible.  Feel free to call the clinic should you have any questions or concerns. The clinic phone number is [phone].  Please show the CHEMO ALERT CARD at check-in to the Emergency Department and triage nurse.  [facility] Discharge Instructions for Patients receiving Home Portable Chemo Pump    **The bag should finish at 46 hours, 96 hours or 7 days. For example, if your pump is scheduled for 46 hours and it was put on at 4pm, it should finish at 2 pm the day it is scheduled to come off regardless of your appointment time.    Estimated time to finish   _________________________ (Have your nurse fill in)     ** if the display on your pump reads "Low Volume" and it is beeping, take the batteries out of the pump and come to the cancer center for it to be taken off.   **If the pump alarms go off prior to the pump reading "Low Volume" then call the [phone] and someone can assist you.  **If the plunger comes out and the bag fluid is running out, please use your chemo spill kit to clean up the  spill. Do not use paper towels or other house hold products.  ** If you have problems or questions regarding your pump, please call either the [phone] or the cancer center Monday-Friday 8:00am-4:30pm at [phone] and we will assist you.  If you are unable to get assistance then go to [facility] Emergency Room, ask the staff to contact the IV team for assistance.     

## 2020-10-16 NOTE — Progress Notes (Signed)
Per Dr. Benay Spice: OK to treat with creatinine 3.10

## 2020-10-16 NOTE — Progress Notes (Addendum)
[place] OFFICE PROGRESS NOTE   Diagnosis: Rectal cancer  INTERVAL HISTORY:   [name] returns for follow-up.  Chemotherapy was held 10/02/2020 due to further increase in the creatinine. On 10/08/2020 he underwent cystoscopy with bilateral ureteral stent exchange.  Dysuria is better.  He continues to have frequent urination.  He feels this is affecting his ability to rest adequately.  Some improvement in appetite.  Colostomy functioning.  No fever.  No nausea or vomiting.  Objective:  Vital signs in last 24 hours:  Blood pressure (!) 144/69, pulse 92, temperature 98.3 F (36.8 C), temperature source Tympanic, resp. rate 18, height 6' (1.829 m), weight 176 lb (79.8 kg), SpO2 98 %.    HEENT: No thrush or ulcers. Resp: Lungs clear bilaterally. Cardio: Regular rate and rhythm. GI: Abdomen soft and nontender.  No hepatomegaly.  Left lower quadrant colostomy. Vascular: No leg edema. Port-A-Cath without erythema.  Lab Results:  Lab Results  Component Value Date   WBC 6.4 10/16/2020   HGB 8.4 (L) 10/16/2020   HCT 27.1 (L) 10/16/2020   MCV 91.9 10/16/2020   PLT 318 10/16/2020   NEUTROABS 4.5 10/16/2020    Imaging:  No results found.  Medications: I have reviewed the patient's current medications.  Assessment/Plan: 1. Metastatic rectal cancer, perforated rectal tumor with abscess, stage IV ? Presented with "diverticulitis "March 2020, status post drainage catheter ? Colonoscopy 10/17/2019-obstructing mass at 10 cm, biopsy confirmed adenocarcinoma ? MRI pelvis 10/22/2019-T3 versus T4, into, M0 lesion-evaluation limited due to abscess ? Neoadjuvant radiation-short course ? 01/17/2020-palliative low anterior resection, end colostomy, biopsy of peritoneal nodules, liver biopsy, left ureter stent, right ureter reimplantation, placement of pelvic drains  Operative findings included chronic perforation of the rectum with involvement of the pelvic sidewalls and  bladder, 2 peritoneal nodules at the sacral promontory, 1 cm mass at segment 4 of the liver  Pathology confirmeda ypT4n, pN1c,M1c,moderately differentiated adenocarcinoma, lymphovascular and perineural invasion present, treatment response score 2, positive distal and radial margins, 0/15 lymph nodes, 1 tumor deposit, no loss of mismatch repair protein expression, MSS, positive for adenocarcinoma involving the right ureter, segment 4 liver, and peritoneal deposits  CTs chest, abdomen, and pelvis 02/23/2020-2 pelvic surgical drains, 2.1 cm hepatic dome hypodensity, 1.8 cm peripheral segment 6/7 lesion, bilateral ureter stents, mild left hydroureteronephrosis, 2 new right lower lobe 0.3 cm nodules-indeterminate  Cycle 1 FOLFOX 03/28/2020  Cycle 2 FOLFOX 04/11/2020  Cycle 3 FOLFOX 04/25/2020  CT 05/21/2020 at [facility]-(compared to CT abdomen/pelvis 02/23/2020 ) mild to moderate right hydroureteronephrosis increased since prior. Slight distal migration of the right ureteral stent with the proximal tip terminating in the proximal right ureter. Mild left hydronephrosis. Similar to prior. Unchanged position of left ureteral stent. Stable sites of mesorectal fascial thickening and decreased size of liver lesions since the comparison study.  Cycle 4 FOLFOX 06/17/2020  Cycle 5 FOLFOX 07/01/2020  Cycle 6 FOLFOX 08/19/2020 (oxaliplatin dose reduced due to elevated creatinine)  CTs at [facility] 09/04/2020-resolution of previously noted pulmonary nodules, no new nodules, new hypodense hepatic dome lesion concerning for worsening hepatic metastatic disease, mild right greater than left hydronephrosis-improved, bilateral ureteral stents  Cycle 7 FOLFOX 09/10/2020  Cycle 8 FOLFOX 10/16/2020 2.Diverticulitis/perforated rectal tumor-pelvic drains remain in place 3.Coronary artery disease 4.Chronic systolic heart failure 5.History of anLV thrombus, status post course of Eliquis, no thrombus visualized on  echocardiogram 09/15/2019, LVEF 45-50 6.Renal ultrasound 03/15/2020-moderate bilateral hydronephrosis with ureteral stents in position bilaterally--bilateral stent exchangeDr. Lottie Rater, [facility]rology at [facility]  03/22/2020 7. Anemia secondary to chemotherapy, chronic disease, and renal failure 8. Renal failure-improved following ureter stent exchange April 2021  Progressive renal failure 05/08/2020-ultrasound with mild to moderate left hydronephrosis, resolution of right hydronephrosis, and left double-J stent was not appreciated  Stent exchange at [facility] 05/22/2020  Progressive renal failure 07/29/2020-chemotherapy held, [facility] urology contacted for stent exchange  Stent exchange August 09, 2020  Renal ultrasound 10/02/2020-increasing right hydronephrosis  Bilateral stent exchange Dr. Claudia Desanctis 10/08/2020  Disposition: [name] appears unchanged.  He has completed 7 cycles of FOLFOX.  Chemotherapy on hold recently due to progressive renal failure.  He underwent stent exchange 10/08/2020.  Creatinine is better today but remains elevated.  Plan to proceed with cycle 8 FOLFOX today as scheduled.  He is scheduled to return to [facility] on 10/30/2020 for restaging CTs and follow-up with Dr. Aleatha Borer.  He will return for follow-up here in 3 weeks.  He will contact the office in the interim with any problems.  Patient seen with Dr. Benay Spice.  Ned Card ANP/GNP-BC   10/16/2020  12:16 PM This was a shared visit with Ned Card.  Mr. Beezley continues to have dysuria following bilateral ureter stent exchange last week.  The creatinine has improved partially.  He will complete another cycle of FOLFOX today.  He is scheduled for restaging evaluation at [facility] next week.  Julieanne Manson, MD

## 2020-10-18 ENCOUNTER — Inpatient Hospital Stay: Payer: Medicare Other

## 2020-10-18 ENCOUNTER — Other Ambulatory Visit: Payer: Self-pay

## 2020-10-18 ENCOUNTER — Telehealth: Payer: Self-pay | Admitting: Nurse Practitioner

## 2020-10-18 VITALS — BP 118/66 | HR 58 | Temp 98.6°F | Resp 18

## 2020-10-18 DIAGNOSIS — C20 Malignant neoplasm of rectum: Secondary | ICD-10-CM

## 2020-10-18 DIAGNOSIS — Z5111 Encounter for antineoplastic chemotherapy: Secondary | ICD-10-CM | POA: Diagnosis not present

## 2020-10-18 MED ORDER — HEPARIN SOD (PORK) LOCK FLUSH 100 UNIT/ML IV SOLN
500.0000 [IU] | Freq: Once | INTRAVENOUS | Status: AC | PRN
  Administered 2020-10-18: 500 [IU]
  Filled 2020-10-18: qty 5

## 2020-10-18 MED ORDER — SODIUM CHLORIDE 0.9% FLUSH
10.0000 mL | INTRAVENOUS | Status: DC | PRN
  Administered 2020-10-18: 10 mL
  Filled 2020-10-18: qty 10

## 2020-10-18 NOTE — Patient Instructions (Signed)

## 2020-10-18 NOTE — Telephone Encounter (Signed)
Scheduled appointments per 11/17 los. Spoke to patient who is aware of appointments dates and times.

## 2020-10-25 ENCOUNTER — Other Ambulatory Visit: Payer: Self-pay | Admitting: *Deleted

## 2020-10-25 ENCOUNTER — Telehealth: Payer: Self-pay | Admitting: *Deleted

## 2020-10-25 MED ORDER — MAGIC MOUTHWASH
5.0000 mL | Freq: Four times a day (QID) | ORAL | 0 refills | Status: DC | PRN
Start: 2020-10-25 — End: 2023-03-12

## 2020-10-25 MED ORDER — MAGIC MOUTHWASH
5.0000 mL | Freq: Four times a day (QID) | ORAL | 0 refills | Status: DC | PRN
Start: 2020-10-25 — End: 2020-10-25

## 2020-10-25 NOTE — Telephone Encounter (Signed)
Call from [place] to report that [facility] told them that they do not compound the MMW there. Needs to go to [facility] in [place] instead. Script sent to [facility].

## 2020-10-25 NOTE — Telephone Encounter (Signed)
Reports he has developed mouth sores and is uncomfortable. Instructed her to have him rinse mouth several times day with baking soda/warm water. Will call in script for MMW to swish and spit QID and also suggested she get Oragel and can put on Q-Tip and dab on actual sore to numb it so he can eat.

## 2020-10-25 NOTE — Addendum Note (Signed)
Addended by: Tania Ade on: 10/25/2020 01:14 PM   Modules accepted: Orders

## 2020-11-03 ENCOUNTER — Other Ambulatory Visit: Payer: Self-pay | Admitting: Oncology

## 2020-11-05 MED FILL — Dexamethasone Sodium Phosphate Inj 100 MG/10ML: INTRAMUSCULAR | Qty: 1 | Status: AC

## 2020-11-06 ENCOUNTER — Inpatient Hospital Stay: Payer: Medicare Other

## 2020-11-06 ENCOUNTER — Inpatient Hospital Stay: Payer: Medicare Other | Admitting: Nurse Practitioner

## 2020-11-06 NOTE — Progress Notes (Deleted)
[place] OFFICE PROGRESS NOTE   Diagnosis:   INTERVAL HISTORY:   ***  Objective:  Vital signs in last 24 hours:  There were no vitals taken for this visit.    HEENT: *** Lymphatics: *** Resp: *** Cardio: *** GI: *** Vascular: *** Neuro:***  Skin:***   Portacath/PICC-without erythema  Lab Results:  Lab Results  Component Value Date   WBC 6.4 10/16/2020   HGB 8.4 (L) 10/16/2020   HCT 27.1 (L) 10/16/2020   MCV 91.9 10/16/2020   PLT 318 10/16/2020   NEUTROABS 4.5 10/16/2020    CMP  Lab Results  Component Value Date   NA 139 10/16/2020   K 4.4 10/16/2020   CL 113 (H) 10/16/2020   CO2 18 (L) 10/16/2020   GLUCOSE 136 (H) 10/16/2020   BUN 41 (H) 10/16/2020   CREATININE 3.10 (HH) 10/16/2020   CALCIUM 9.1 10/16/2020   PROT 7.3 10/16/2020   ALBUMIN 2.7 (L) 10/16/2020   AST 8 (L) 10/16/2020   ALT 6 10/16/2020   ALKPHOS 85 10/16/2020   BILITOT 0.3 10/16/2020   GFRNONAA 21 (L) 10/16/2020   GFRAA 27 (L) 08/19/2020    Lab Results  Component Value Date   CEA1 2.71 08/12/2020    Lab Results  Component Value Date   INR 1.2 03/14/2020    Imaging:  No results found.  Medications: I have reviewed the patient's current medications.   Assessment/Plan: 1. Metastatic rectal cancer, perforated rectal tumor with abscess, stage IV ? Presented with "diverticulitis "March 2020, status post drainage catheter ? Colonoscopy 10/17/2019-obstructing mass at 10 cm, biopsy confirmed adenocarcinoma ? MRI pelvis 10/22/2019-T3 versus T4, into, M0 lesion-evaluation limited due to abscess ? Neoadjuvant radiation-short course ? 01/17/2020-palliative low anterior resection, end colostomy, biopsy of peritoneal nodules, liver biopsy, left ureter stent, right ureter reimplantation, placement of pelvic drains  Operative findings included chronic perforation of the rectum with involvement of the pelvic sidewalls and bladder, 2 peritoneal nodules at the sacral  promontory, 1 cm mass at segment 4 of the liver  Pathology confirmeda ypT4n, pN1c,M1c,moderately differentiated adenocarcinoma, lymphovascular and perineural invasion present, treatment response score 2, positive distal and radial margins, 0/15 lymph nodes, 1 tumor deposit, no loss of mismatch repair protein expression, MSS, positive for adenocarcinoma involving the right ureter, segment 4 liver, and peritoneal deposits  CTs chest, abdomen, and pelvis 02/23/2020-2 pelvic surgical drains, 2.1 cm hepatic dome hypodensity, 1.8 cm peripheral segment 6/7 lesion, bilateral ureter stents, mild left hydroureteronephrosis, 2 new right lower lobe 0.3 cm nodules-indeterminate  Cycle 1 FOLFOX 03/28/2020  Cycle 2 FOLFOX 04/11/2020  Cycle 3 FOLFOX 04/25/2020  CT 05/21/2020 at [facility]-(compared to CT abdomen/pelvis 02/23/2020 ) mild to moderate right hydroureteronephrosis increased since prior. Slight distal migration of the right ureteral stent with the proximal tip terminating in the proximal right ureter. Mild left hydronephrosis. Similar to prior. Unchanged position of left ureteral stent. Stable sites of mesorectal fascial thickening and decreased size of liver lesions since the comparison study.  Cycle 4 FOLFOX 06/17/2020  Cycle 5 FOLFOX 07/01/2020  Cycle 6 FOLFOX 08/19/2020 (oxaliplatin dose reduced due to elevated creatinine)  CTs at [facility] 09/04/2020-resolution of previously noted pulmonary nodules, no new nodules, new hypodense hepatic dome lesion concerning for worsening hepatic metastatic disease, mild right greater than left hydronephrosis-improved, bilateral ureteral stents  Cycle 7 FOLFOX 09/10/2020  Cycle 8 FOLFOX 10/16/2020 2.Diverticulitis/perforated rectal tumor-pelvic drains remain in place 3.Coronary artery disease 4.Chronic systolic heart failure 5.History of anLV thrombus, status post course  of Eliquis, no thrombus visualized on echocardiogram 09/15/2019, LVEF  45-50 6.Renal ultrasound 03/15/2020-moderate bilateral hydronephrosis with ureteral stents in position bilaterally--bilateral stent exchangeDr. Lottie Rater, urology at [facility] 03/22/2020 7. Anemia secondary to chemotherapy, chronic disease, and renal failure 8. Renal failure-improved following ureter stent exchange April 2021  Progressive renal failure 05/08/2020-ultrasound with mild to moderate left hydronephrosis, resolution of right hydronephrosis, and left double-J stent was not appreciated  Stent exchange at [facility] 05/22/2020  Progressive renal failure 07/29/2020-chemotherapy held, [facility] urology contacted for stent exchange  Stent exchange August 09, 2020  Renal ultrasound 10/02/2020-increasing right hydronephrosis  Bilateral stent exchange Dr. Claudia Desanctis 10/08/2020    Disposition: ***  Betsy Coder, MD  11/06/2020  8:31 AM

## 2021-11-30 DEATH — deceased

## 2022-02-01 IMAGING — XA IR IMAGING GUIDED PORT INSERTION
1 series · 1 of 1 positions shown · non-contrast
Comparison: none

CLINICAL DATA: RECTAL CANCER, ACCESS FOR CHEMOTHERAPY

[Series 300: line placements · 1 of 1 slices shown]
[im 1/1]
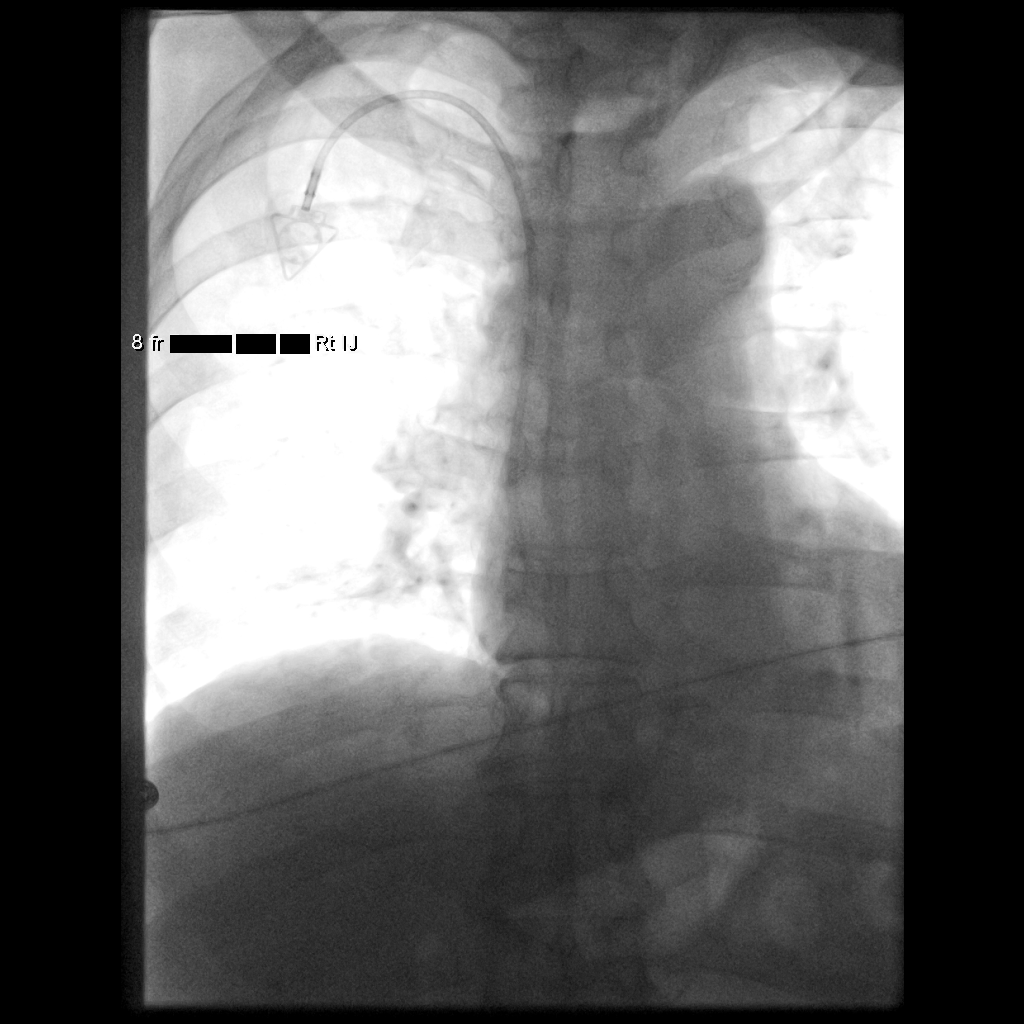

[1 of 1 positions shown; findings below may reference images not displayed]

EXAM:
RIGHT INTERNAL JUGULAR SINGLE LUMEN POWER PORT CATHETER INSERTION

Radiologist:  Geiger, Hang

Guidance:  Ultrasound and fluoroscopic

MEDICATIONS:
Ancef 2 g; The antibiotic was administered within an appropriate
time interval prior to skin puncture.

ANESTHESIA/SEDATION:
Versed 2.0 mg IV; Fentanyl 100 mcg IV;

Moderate Sedation Time:  20 minutes

The patient was continuously monitored during the procedure by the
interventional radiology nurse under my direct supervision.

FLUOROSCOPY TIME:  0 minutes, 24 seconds (4 mGy)

COMPLICATIONS:
None immediate.

CONTRAST:  None

PROCEDURE:
Informed consent was obtained from the patient following explanation
of the procedure, risks, benefits and alternatives. The patient
understands, agrees and consents for the procedure. All questions
were addressed. A time out was performed.

Maximal barrier sterile technique utilized including caps, mask,
sterile gowns, sterile gloves, large sterile drape, hand hygiene,
and 2% chlorhexidine scrub.

Under sterile conditions and local anesthesia, right internal
jugular micropuncture venous access was performed. Access was
performed with ultrasound. Images were obtained for documentation of
the patent right internal jugular vein. A guide wire was inserted
followed by a transitional dilator. This allowed insertion of a
guide wire and catheter into the IVC. Measurements were obtained
from the SVC / RA junction back to the right IJ venotomy site. In
the right infraclavicular chest, a subcutaneous pocket was created
over the second anterior rib. This was done under sterile conditions
and local anesthesia. 1% lidocaine with epinephrine was utilized for
this. A 2.5 cm incision was made in the skin. Blunt dissection was
performed to create a subcutaneous pocket over the right pectoralis
major muscle. The pocket was flushed with saline vigorously. There
was adequate hemostasis. The port catheter was assembled and checked
for leakage. The port catheter was secured in the pocket with two
retention sutures. The tubing was tunneled subcutaneously to the
right venotomy site and inserted into the SVC/RA junction through a
valved peel-away sheath. Position was confirmed with fluoroscopy.
Images were obtained for documentation. The patient tolerated the
procedure well. No immediate complications. Incisions were closed in
a two layer fashion with 4 - 0 Vicryl suture. Dermabond was applied
to the skin. The port catheter was accessed, blood was aspirated
followed by saline and heparin flushes. Needle was removed. A dry
sterile dressing was applied.
IMPRESSION: Ultrasound and fluoroscopically guided right internal jugular single
lumen power port catheter insertion. Tip in the SVC/RA junction.
Catheter ready for use.

## 2022-02-02 IMAGING — US US RENAL
1 series · 14 of 25 positions shown · non-contrast
Comparison: CT abdomen pelvis, 02/23/2020

CLINICAL DATA: Acute renal failure, rising creatinine

EXAM:
RENAL / URINARY TRACT ULTRASOUND COMPLETE

[Series 1: us renal · 14 of 46 slices shown]
[im 1/46]
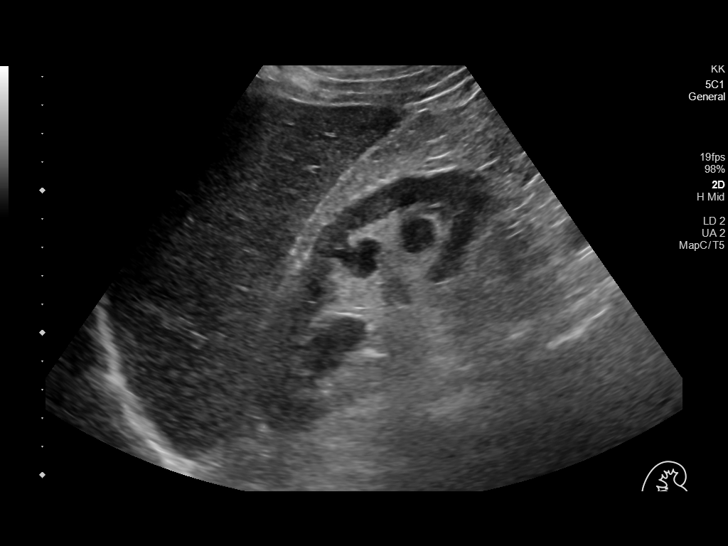
[im 4/46]
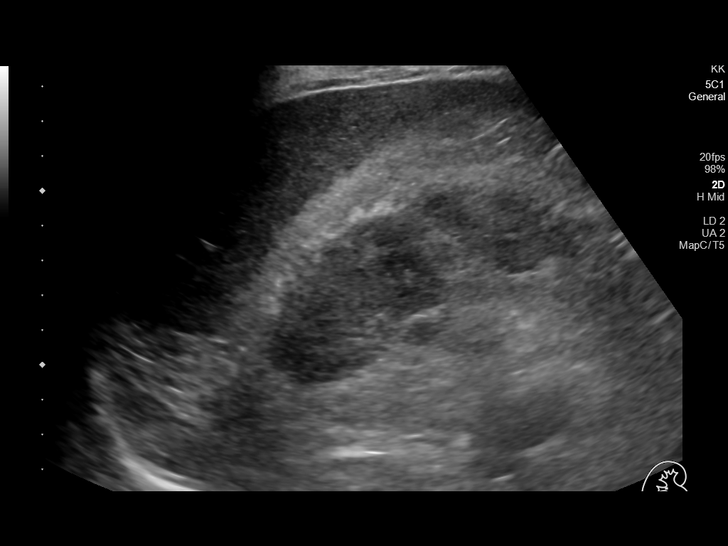
[im 8/46]
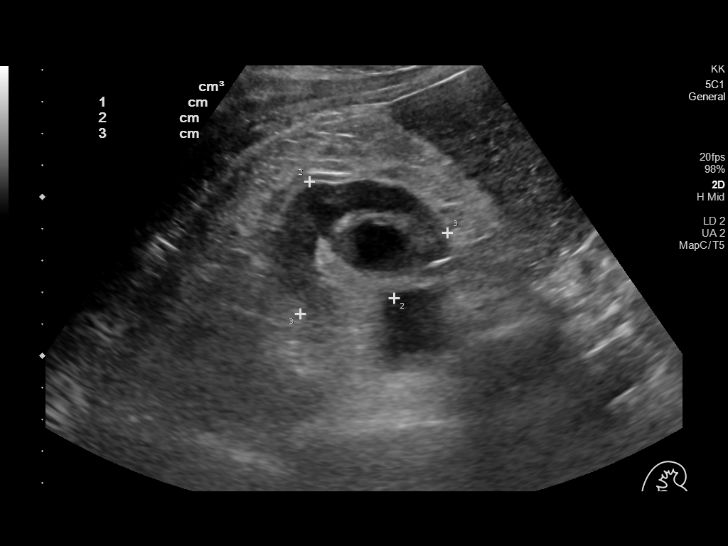
[im 12/46]
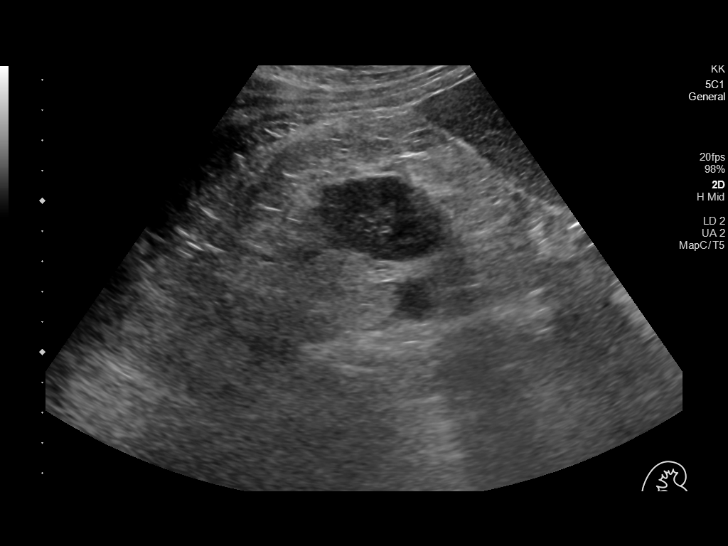
[im 16/46]
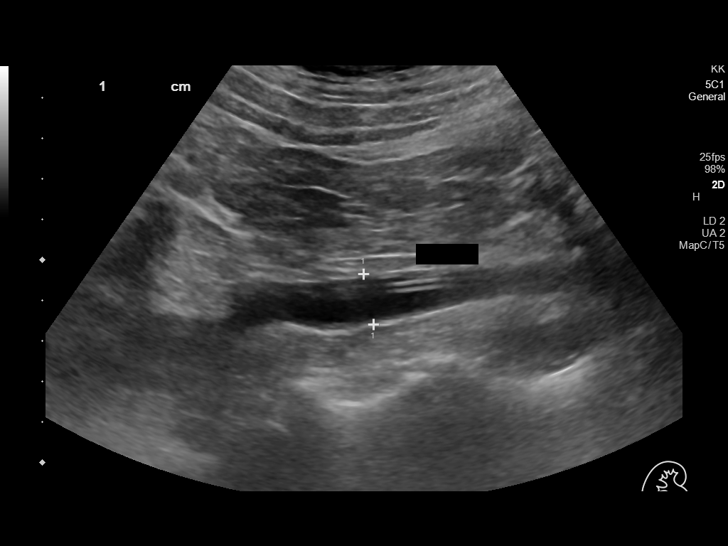
[im 17/46]
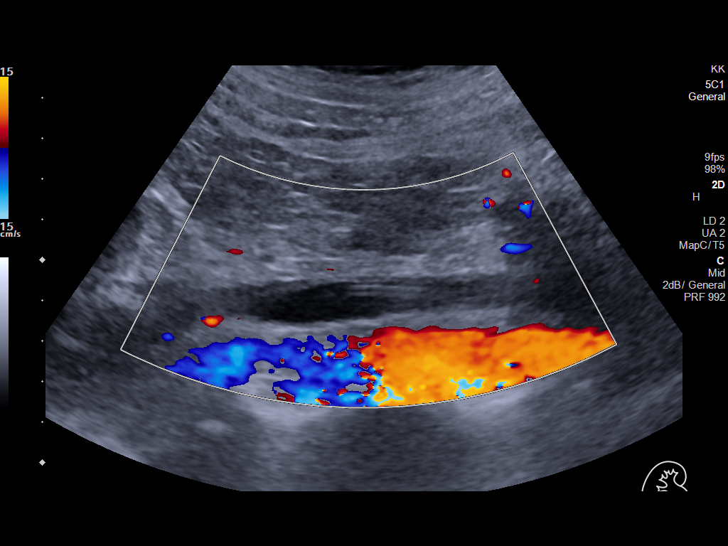
[im 21/46]
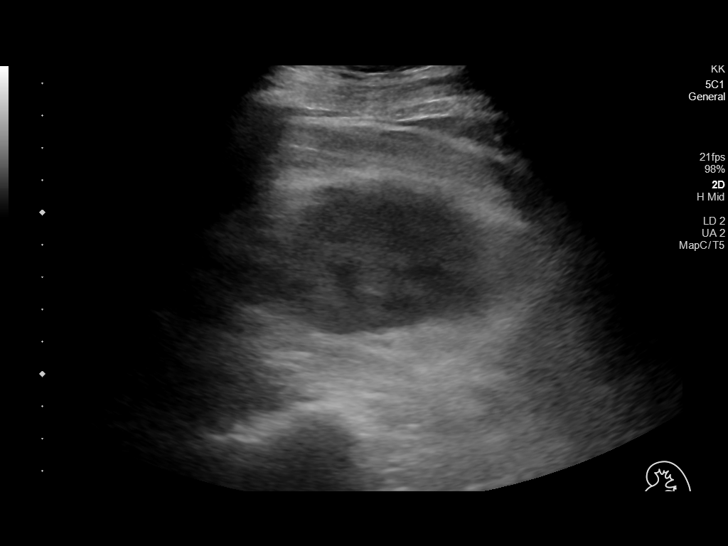
[im 25/46]
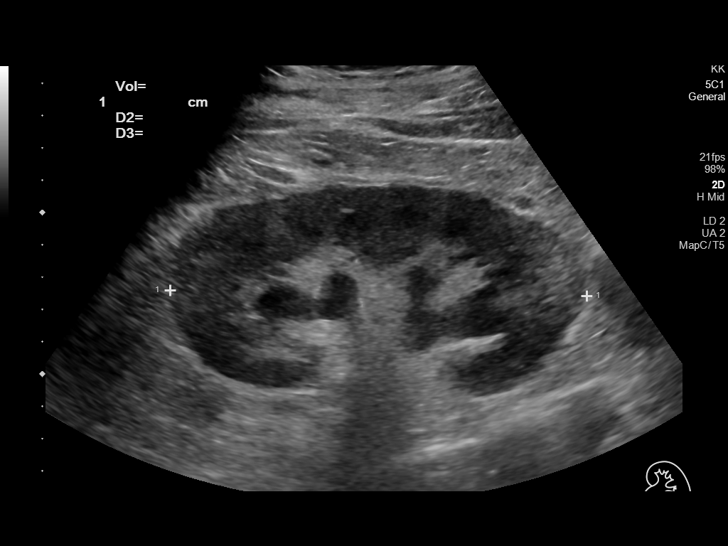
[im 29/46]
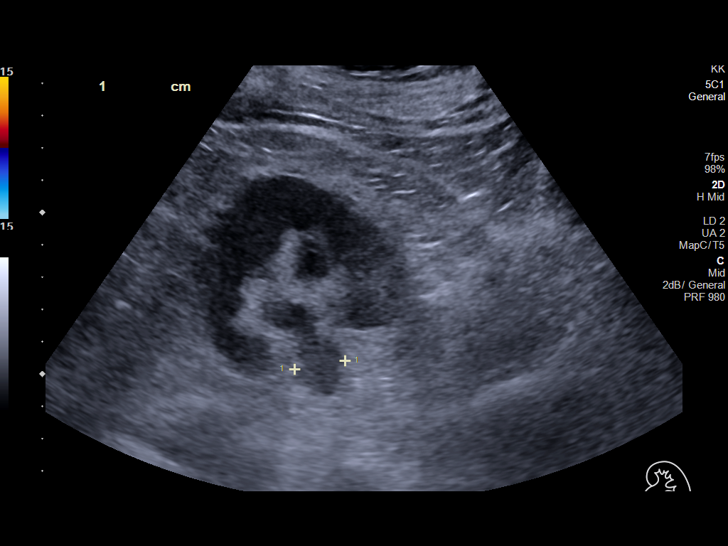
[im 31/46]
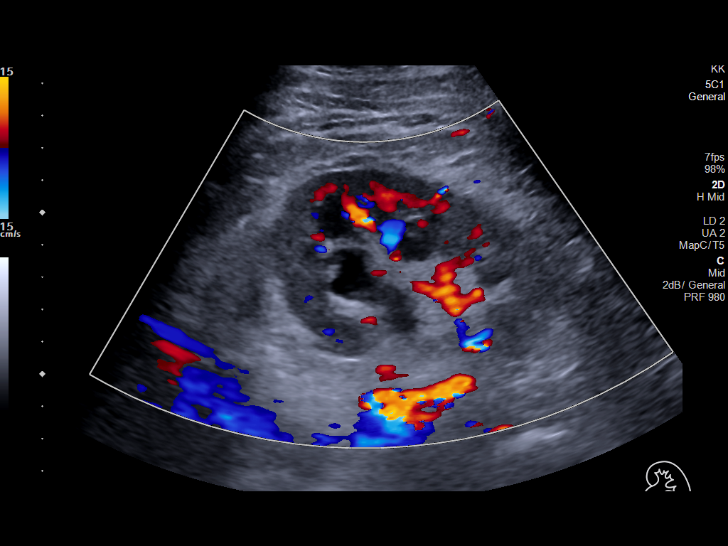
[im 34/46]
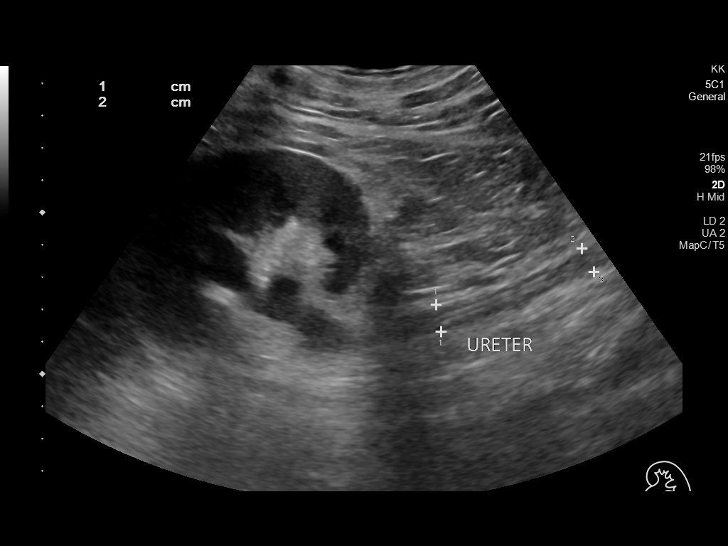
[im 38/46]
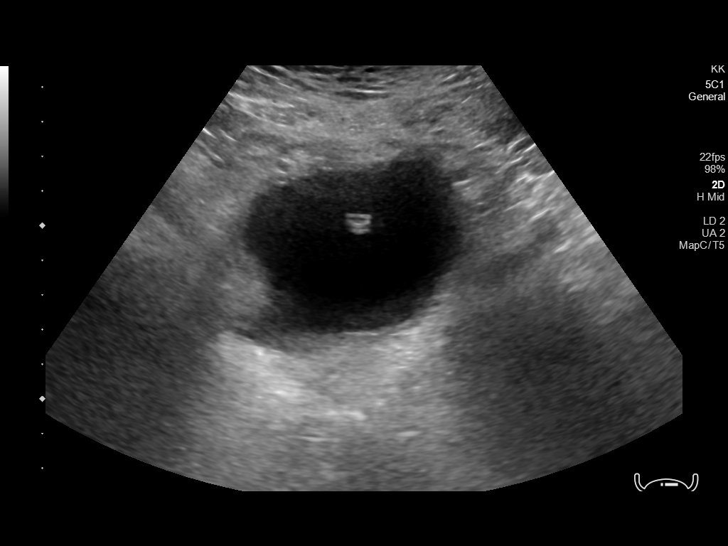
[im 42/46]
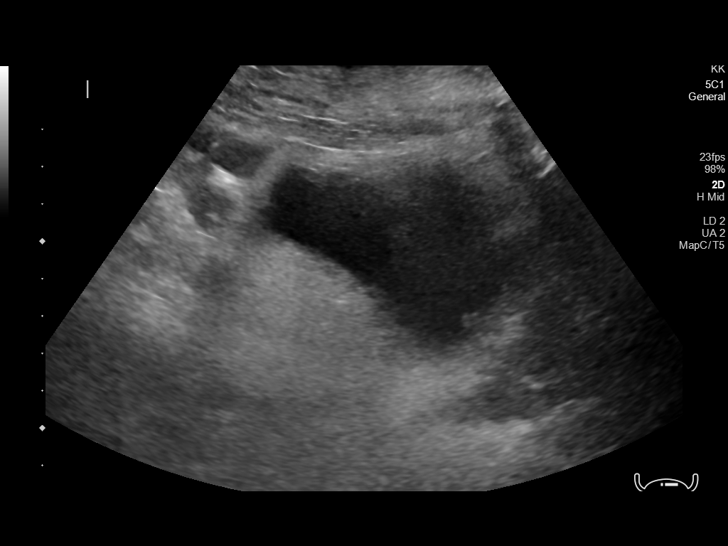
[im 46/46]
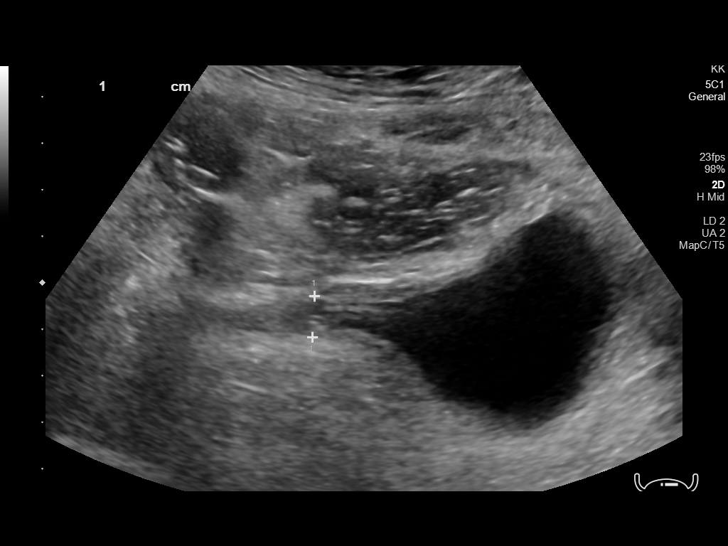

[14 of 25 positions shown; findings below may reference images not displayed]

FINDINGS: Right Kidney:

Renal measurements: 12.0 x 4.5 x 5.3 cm = volume: 150 mL.
Echogenicity within normal limits. Moderate right hydronephrosis
with formed double-J ureteral stent pigtail in the renal pelvis.

Left Kidney:

Renal measurements: 12.9 x 5.5 x 6.9 cm = volume: 256 mL.
Echogenicity within normal limits. Moderate left hydronephrosis with
formed double-J ureteral stent pigtail in the renal pelvis.

Bladder:

Appears normal for degree of bladder distention. Formed pigtails in
the bladder.

Other:

None.
IMPRESSION: Moderate bilateral hydronephrosis with formed double-J ureteral
stents in position bilaterally, degree of hydronephrosis in keeping
with findings of prior CT dated 02/23/2020.

These results will be called to the ordering clinician or
representative by the Radiologist Assistant, and communication
documented in the PACS or [REDACTED].

## 2022-03-28 IMAGING — US US RENAL
1 series · 13 of 25 positions shown · non-contrast
Comparison: March 15, 2020

CLINICAL DATA: Acute renal failure

EXAM:
RENAL / URINARY TRACT ULTRASOUND COMPLETE

[Series 1: us renal · 13 of 43 slices shown]
[im 1/43]
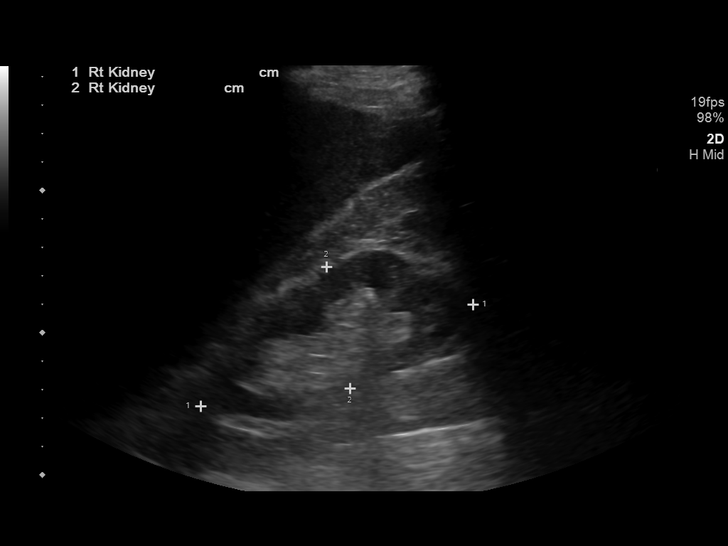
[im 4/43]
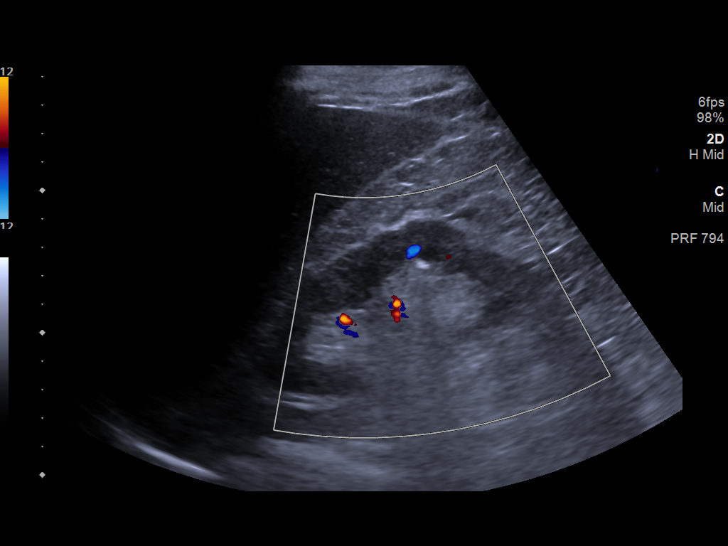
[im 8/43]
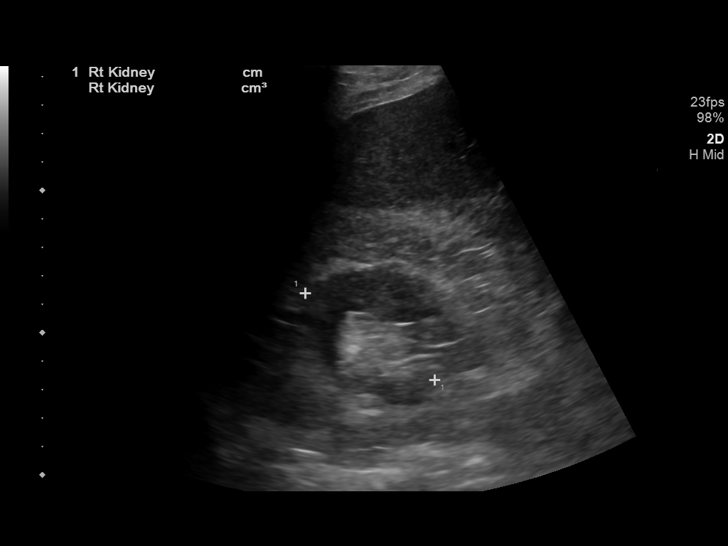
[im 11/43]
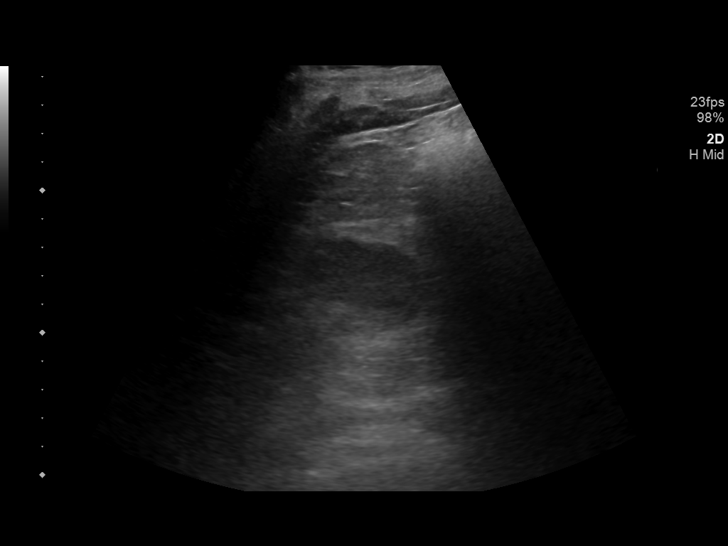
[im 15/43]
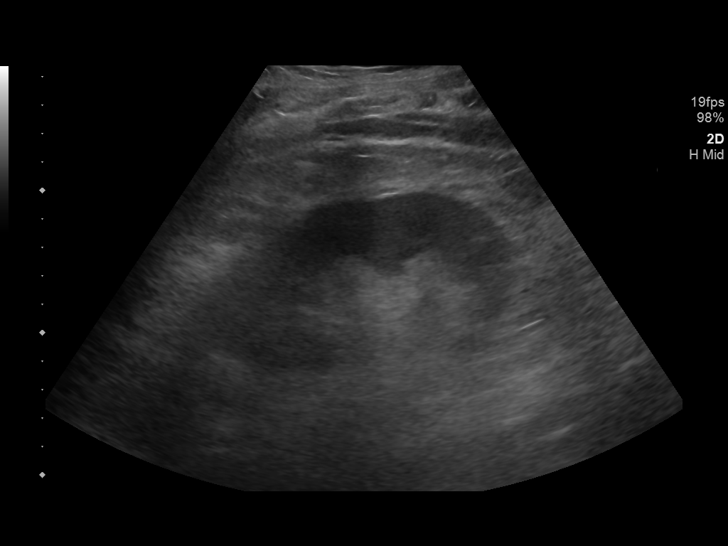
[im 18/43]
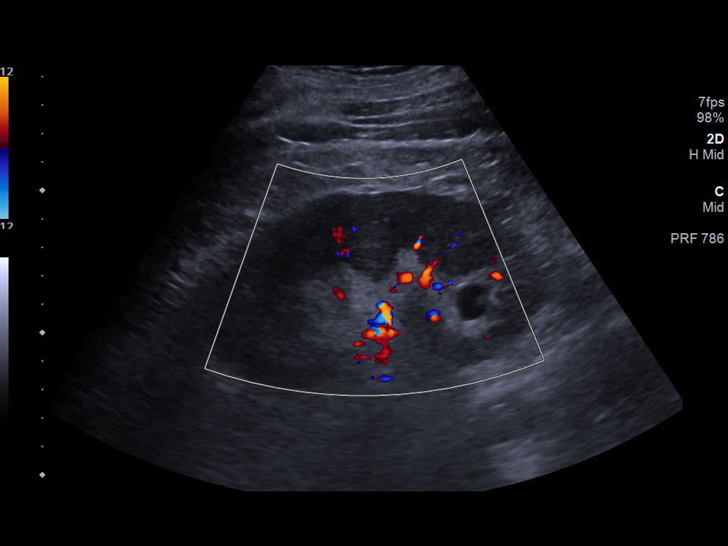
[im 22/43]
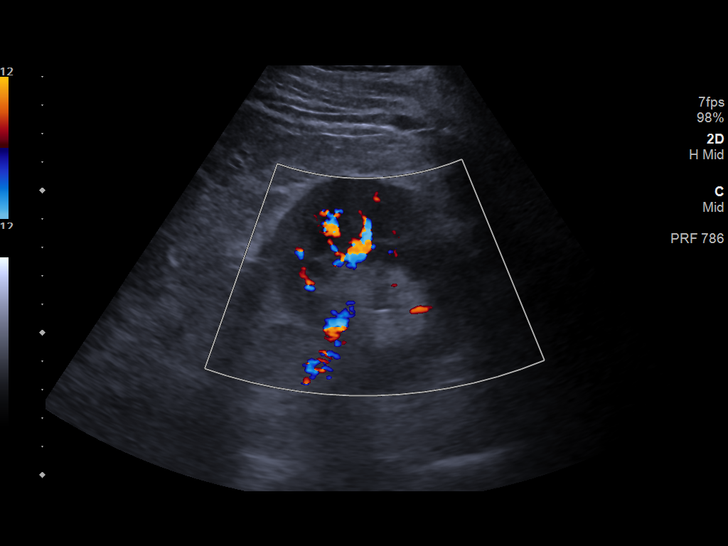
[im 25/43]
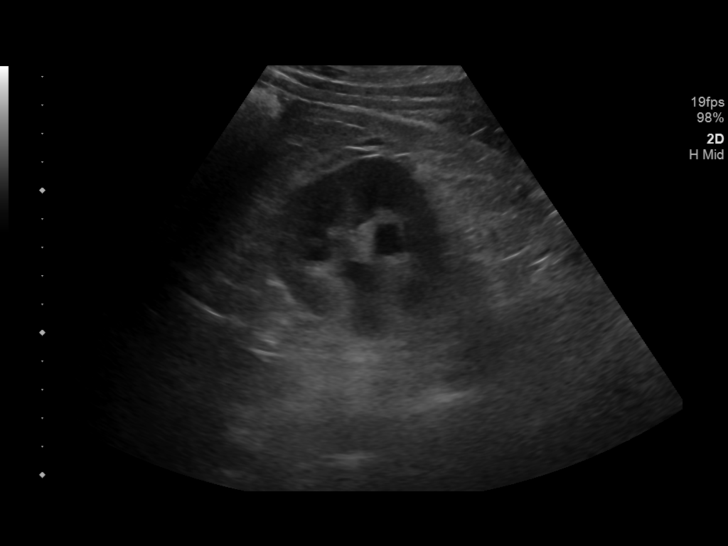
[im 29/43]
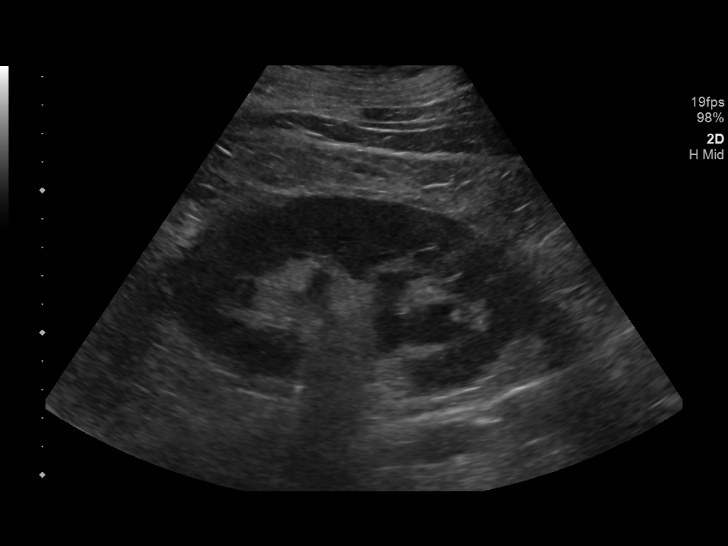
[im 32/43]
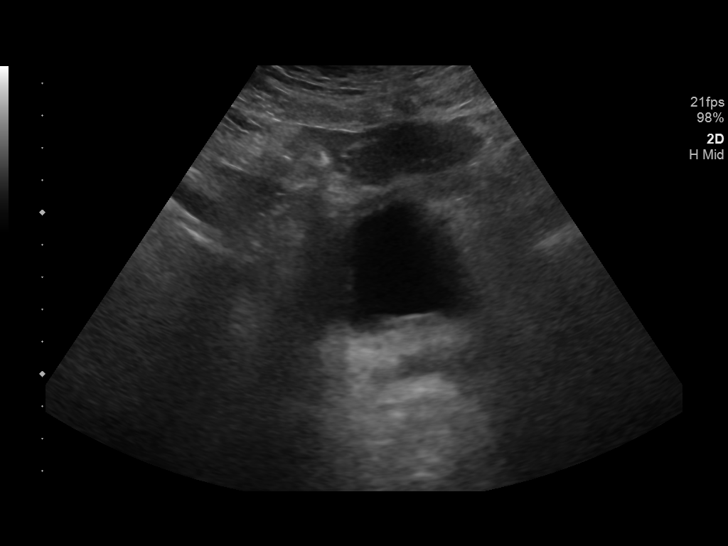
[im 36/43]
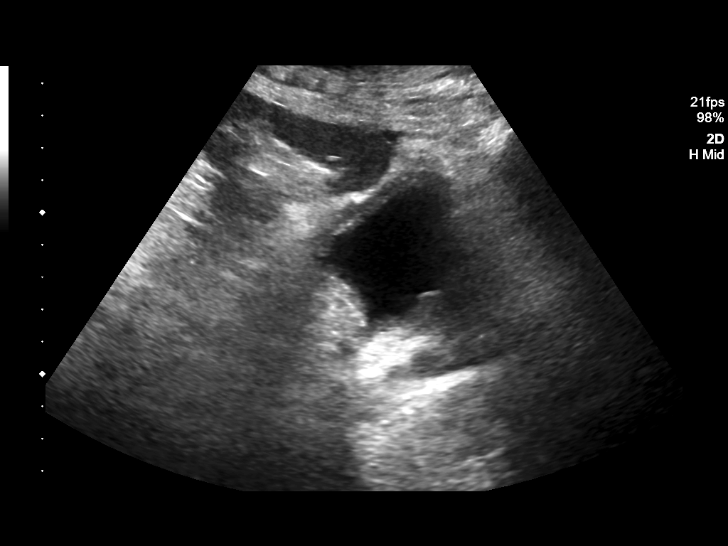
[im 39/43]
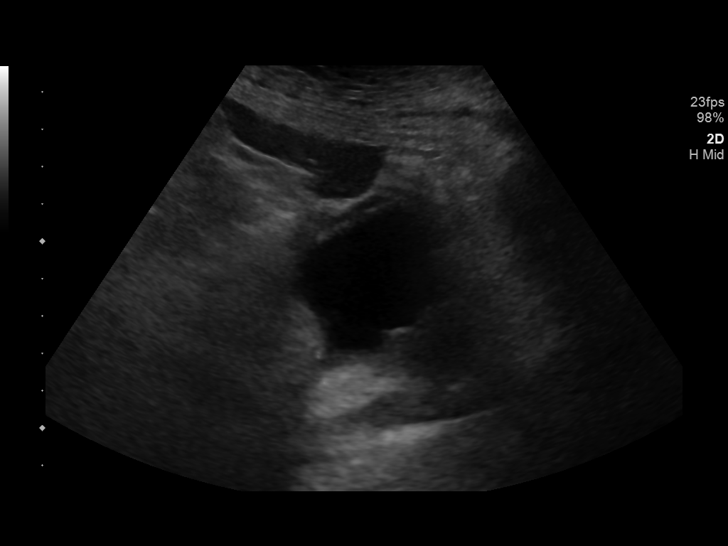
[im 43/43]
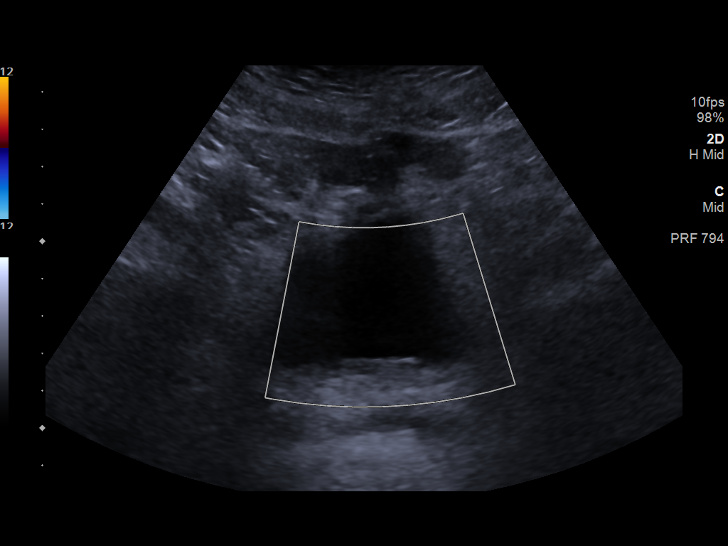

[13 of 25 positions shown; findings below may reference images not displayed]

FINDINGS: Right Kidney:

Renal measurements: 10.2 x 4.4 x 5.5 cm = volume: 127 mL .
Echogenicity and renal cortical thickness are within normal limits.
No mass, perinephric fluid, or hydronephrosis visualized. No
sonographically demonstrable calculus or ureterectasis.

Left Kidney:

Renal measurements: 14.0 x 6.3 x 6.4 cm = volume: 291 mL.
Echogenicity and renal cortical thickness are within normal limits.
No mass or perinephric fluid visualized. Mild to moderate
hydronephrosis is present on the left, less notable than on previous
study. No sonographically demonstrable calculus or ureterectasis.

Bladder:

Appears normal for degree of bladder distention.

Other:

None.
IMPRESSION: Mild to moderate hydronephrosis on the left, slightly less notable
than on previous study. Site of obstruction not appreciable on the
left by ultrasound. Previously noted double-J stent on the left not
appreciable currently.

Resolution of hydronephrosis on the right compared to previous
study.

Apparent size discrepancy between kidneys. Significance of this
finding uncertain. Size discrepancy not appreciable on recent prior
study.

## 2022-06-22 ENCOUNTER — Other Ambulatory Visit: Payer: Self-pay
